# Patient Record
Sex: Female | Born: 1970
Health system: Southern US, Community
[De-identification: ages and names within clinical notes are randomized; demographics above are authoritative.]

## PROBLEM LIST (undated history)

## (undated) DIAGNOSIS — J449 Chronic obstructive pulmonary disease, unspecified: Secondary | ICD-10-CM

## (undated) DIAGNOSIS — F17200 Nicotine dependence, unspecified, uncomplicated: Secondary | ICD-10-CM

## (undated) DIAGNOSIS — F1910 Other psychoactive substance abuse, uncomplicated: Secondary | ICD-10-CM

## (undated) DIAGNOSIS — T8130XA Disruption of wound, unspecified, initial encounter: Secondary | ICD-10-CM

## (undated) HISTORY — PX: ABDOMINAL HYSTERECTOMY: SHX81

## (undated) HISTORY — DX: Chronic obstructive pulmonary disease, unspecified: J44.9

## (undated) HISTORY — DX: Nicotine dependence, unspecified, uncomplicated: F17.200

---

## 2001-06-04 ENCOUNTER — Ambulatory Visit (HOSPITAL_COMMUNITY): Admission: RE | Admit: 2001-06-04 | Discharge: 2001-06-04 | Payer: Self-pay | Admitting: Obstetrics and Gynecology

## 2001-06-04 ENCOUNTER — Encounter: Payer: Self-pay | Admitting: Obstetrics and Gynecology

## 2002-12-07 ENCOUNTER — Emergency Department (HOSPITAL_COMMUNITY): Admission: EM | Admit: 2002-12-07 | Discharge: 2002-12-07 | Payer: Self-pay | Admitting: Emergency Medicine

## 2002-12-07 ENCOUNTER — Encounter: Payer: Self-pay | Admitting: *Deleted

## 2004-10-20 ENCOUNTER — Emergency Department (HOSPITAL_COMMUNITY): Admission: EM | Admit: 2004-10-20 | Discharge: 2004-10-20 | Payer: Self-pay | Admitting: Emergency Medicine

## 2005-11-25 ENCOUNTER — Emergency Department (HOSPITAL_COMMUNITY): Admission: EM | Admit: 2005-11-25 | Discharge: 2005-11-25 | Payer: Self-pay | Admitting: Emergency Medicine

## 2006-11-22 ENCOUNTER — Emergency Department (HOSPITAL_COMMUNITY): Admission: EM | Admit: 2006-11-22 | Discharge: 2006-11-22 | Payer: Self-pay | Admitting: Emergency Medicine

## 2008-07-23 ENCOUNTER — Emergency Department (HOSPITAL_COMMUNITY): Admission: EM | Admit: 2008-07-23 | Discharge: 2008-07-23 | Payer: Self-pay | Admitting: Emergency Medicine

## 2009-04-21 ENCOUNTER — Other Ambulatory Visit: Admission: RE | Admit: 2009-04-21 | Discharge: 2009-04-21 | Payer: Self-pay | Admitting: Obstetrics & Gynecology

## 2009-07-13 ENCOUNTER — Encounter: Payer: Self-pay | Admitting: Obstetrics & Gynecology

## 2009-07-13 ENCOUNTER — Inpatient Hospital Stay (HOSPITAL_COMMUNITY): Admission: RE | Admit: 2009-07-13 | Discharge: 2009-07-15 | Payer: Self-pay | Admitting: Obstetrics & Gynecology

## 2009-09-28 ENCOUNTER — Ambulatory Visit: Payer: Self-pay | Admitting: Internal Medicine

## 2009-09-28 DIAGNOSIS — R109 Unspecified abdominal pain: Secondary | ICD-10-CM | POA: Insufficient documentation

## 2009-09-30 ENCOUNTER — Ambulatory Visit (HOSPITAL_COMMUNITY): Admission: RE | Admit: 2009-09-30 | Discharge: 2009-09-30 | Payer: Self-pay | Admitting: Internal Medicine

## 2009-10-01 ENCOUNTER — Encounter: Payer: Self-pay | Admitting: Internal Medicine

## 2009-10-02 DIAGNOSIS — R110 Nausea: Secondary | ICD-10-CM | POA: Insufficient documentation

## 2009-10-02 DIAGNOSIS — R112 Nausea with vomiting, unspecified: Secondary | ICD-10-CM | POA: Insufficient documentation

## 2009-10-02 DIAGNOSIS — R109 Unspecified abdominal pain: Secondary | ICD-10-CM | POA: Insufficient documentation

## 2009-10-02 DIAGNOSIS — R197 Diarrhea, unspecified: Secondary | ICD-10-CM | POA: Insufficient documentation

## 2009-10-02 DIAGNOSIS — K5900 Constipation, unspecified: Secondary | ICD-10-CM | POA: Insufficient documentation

## 2009-10-03 ENCOUNTER — Encounter: Payer: Self-pay | Admitting: Internal Medicine

## 2009-10-03 LAB — CONVERTED CEMR LAB
ALT: 32 units/L (ref 0–35)
AST: 18 units/L (ref 0–37)
Albumin: 4.4 g/dL (ref 3.5–5.2)
Alkaline Phosphatase: 59 units/L (ref 39–117)
Basophils Relative: 0 % (ref 0–1)
CO2: 24 meq/L (ref 19–32)
Calcium: 9.3 mg/dL (ref 8.4–10.5)
Chloride: 105 meq/L (ref 96–112)
Creatinine, Ser: 0.92 mg/dL (ref 0.40–1.20)
HCT: 40.7 % (ref 36.0–46.0)
Hemoglobin, Urine: NEGATIVE
Hemoglobin: 13.3 g/dL (ref 12.0–15.0)
Ketones, ur: NEGATIVE mg/dL
Leukocytes, UA: NEGATIVE
Lipase: 10 units/L (ref 0–75)
Lymphocytes Relative: 24 % (ref 12–46)
Lymphs Abs: 3.4 10*3/uL (ref 0.7–4.0)
Monocytes Relative: 4 % (ref 3–12)
Nitrite: NEGATIVE
Platelets: 220 10*3/uL (ref 150–400)
Protein, ur: NEGATIVE mg/dL
RBC: 4.49 M/uL (ref 3.87–5.11)
Specific Gravity, Urine: >1.03 (ref 1.005–1.030)
Total Bilirubin: 0.3 mg/dL (ref 0.3–1.2)
Urobilinogen, UA: 0.2 (ref 0.0–1.0)
WBC: 14.2 10*3/uL — ABNORMAL HIGH (ref 4.0–10.5)

## 2009-10-04 ENCOUNTER — Telehealth (INDEPENDENT_AMBULATORY_CARE_PROVIDER_SITE_OTHER): Payer: Self-pay

## 2009-10-06 ENCOUNTER — Telehealth (INDEPENDENT_AMBULATORY_CARE_PROVIDER_SITE_OTHER): Payer: Self-pay

## 2009-10-06 ENCOUNTER — Encounter: Payer: Self-pay | Admitting: Internal Medicine

## 2009-10-06 LAB — CONVERTED CEMR LAB
Eosinophils Absolute: 0.3 10*3/uL (ref 0.0–0.7)
HCT: 43.6 % (ref 36.0–46.0)
Lymphocytes Relative: 19 % (ref 12–46)
Lymphs Abs: 2.7 10*3/uL (ref 0.7–4.0)
Neutrophils Relative %: 76 % (ref 43–77)
Platelets: 261 10*3/uL (ref 150–400)
RBC: 4.86 M/uL (ref 3.87–5.11)
RDW: 13.9 % (ref 11.5–15.5)

## 2009-10-10 ENCOUNTER — Ambulatory Visit: Payer: Self-pay | Admitting: Internal Medicine

## 2009-10-10 DIAGNOSIS — L0293 Carbuncle, unspecified: Secondary | ICD-10-CM

## 2009-10-10 DIAGNOSIS — R1013 Epigastric pain: Secondary | ICD-10-CM | POA: Insufficient documentation

## 2009-10-10 DIAGNOSIS — L0292 Furuncle, unspecified: Secondary | ICD-10-CM | POA: Insufficient documentation

## 2009-10-10 DIAGNOSIS — D72829 Elevated white blood cell count, unspecified: Secondary | ICD-10-CM | POA: Insufficient documentation

## 2009-10-10 DIAGNOSIS — K219 Gastro-esophageal reflux disease without esophagitis: Secondary | ICD-10-CM | POA: Insufficient documentation

## 2009-10-10 HISTORY — DX: Furuncle, unspecified: L02.92

## 2009-10-11 ENCOUNTER — Encounter: Payer: Self-pay | Admitting: Internal Medicine

## 2009-10-17 ENCOUNTER — Telehealth (INDEPENDENT_AMBULATORY_CARE_PROVIDER_SITE_OTHER): Payer: Self-pay

## 2010-05-24 ENCOUNTER — Other Ambulatory Visit: Admission: RE | Admit: 2010-05-24 | Discharge: 2010-05-24 | Payer: Self-pay | Admitting: Obstetrics and Gynecology

## 2010-05-29 ENCOUNTER — Emergency Department (HOSPITAL_COMMUNITY): Admission: EM | Admit: 2010-05-29 | Discharge: 2010-05-29 | Payer: Self-pay | Admitting: Emergency Medicine

## 2010-06-28 ENCOUNTER — Ambulatory Visit (HOSPITAL_COMMUNITY): Admission: RE | Admit: 2010-06-28 | Payer: Self-pay | Source: Home / Self Care | Admitting: Obstetrics & Gynecology

## 2010-07-30 ENCOUNTER — Encounter: Payer: Self-pay | Admitting: Obstetrics & Gynecology

## 2010-08-08 NOTE — Progress Notes (Signed)
Summary: phone note, abd pain continues  Phone Note Call from Patient   Caller: Patient Summary of Call: Pt called and said she was seen for abd pain, and got report that her CT was normal. She c/o abd pain right below umbilical, and slightly to the left. She said the pain is so bad at times that it brings her to tears. She has had diarrhea for the last two days, no blood in it. She wakes up nauseated almost every AM. York Spaniel she has f/u appt 11/01/2009, but wants to know what she can do now. Please advise! [facility]). Initial call taken by: Cloria Spring LPN,  October 06, 2009 10:57 AM     Appended Document: phone note, abd pain continues per RMR pt needs to have cbc repeated before we can do anything. If pain becomes severe she should go to ED. Spoke with pt, she just had bloodwork done. Informed her of above. pt verbalized understanding.   Appended Document: phone note, abd pain continues needs ov this week w extender ; wbc remains elvated  Appended Document: phone note, abd pain continues Spoke with pt, she has an appt this AM with Tana Coast, PA.
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
## 2010-08-08 NOTE — Progress Notes (Signed)
Summary: still having pain  Phone Note Call from Patient Call back at [phone]   Caller: Patient Summary of Call: pt called-Hyoscyamine is not helping, she is still having alot of pain and nausea. no fever. She stated she seems to have a hard time controlling her bladder since she started taking the hyoscyamine.  Wants to know if there is something else she can take to help? please advise. Initial call taken by: Hendricks Limes LPN,  October 17, 2009 9:10 AM     Appended Document: still having pain stop hyosyamine; try librax on capsule ac and at bedtime #40 X 10 days (no refills); if no beter , may need tcs  Appended Document: still having pain pt aware, rx called to [facility] pharmacy  Appended Document: still having pain Need PR.  Appended Document: still having pain tried to call pt- NA, no voicemail  Appended Document: still having pain spoke with pts daughters boyfriend- whose number is the one listed in emr. He stated pt didnt have a working phone right now and when he sees her, he will tell her to call.   Appended Document: still having pain will address at upcoming ov

## 2010-08-08 NOTE — Miscellaneous (Signed)
Summary: Orders Update  Clinical Lists Changes  Orders: Added new Test order of T-CBC w/Diff ([id number]) - Signed 

## 2010-08-08 NOTE — Assessment & Plan Note (Signed)
Summary: to be seen with extender per rmr.gu   Visit Type:  f/u Primary Care Provider:  None  Chief Complaint:  still having abd pain.  History of Present Illness:  Patient comes back for short interval f/u due to ongoing lower abdominal pain. Pain continues to wake her up. It is unrelated to meals or BMs. She has pain everyday but at times without pain. Pain is at site of prior c-section and just above. It lasts for five to 30 mins at time. She lays in fetal position until it goes away. She gets dizzy and sweaty with it. Worse since total hysterectomy in 1/10. No improvement in pain, since bowel movements more regular. Stools are soft to loose, without blood. She c/o heartburn at least four times per week, with regurgitation to her throat. Denies urinary symptoms. She takes Cipro daily for chronic bumps in her groin and buttocks area. She has been on this since 12/10 as per Dr. Despina Hidden.   Recent labs showed glucose of 54, white count has been more than 14,000 twice. LFTs and U/A normal. CT A/P showed large stool burden.  Current Medications (verified): 1)  Estrogen 2mg  .... Once Daily 2)  Cipro 500 Mg Tabs (Ciprofloxacin Hcl) .... Once Daily  X 3 Months 3)  Percocet 5-325 Mg Tabs (Oxycodone-Acetaminophen) .... As Needed 4)  Miralax  Powd (Polyethylene Glycol 3350) .... Once Daily 5)  Benefiber  Powd (Wheat Dextrin) .... Once Daily 6)  Probiotic .... Once Daily  Allergies (verified): No Known Drug Allergies  Review of Systems      See HPI  Vital Signs:  Patient profile:   40 year old female Height:      68 inches Weight:      157 pounds BMI:     23.96 Temp:     98.7 degrees F oral Pulse rate:   80 / minute BP sitting:   132 / 90  (left arm) Cuff size:   regular  Vitals Entered By: Hendricks Limes LPN (October 10, 1608 9:38 AM)  Physical Exam  General:  Well developed, well nourished, no acute distress. Head:  Normocephalic and atraumatic. Eyes:  Sclera nonicteric. Mouth:  OP  moist. Abdomen:  Soft. Positive BS. Mild to moderate tenderness in the suprapubic region but also both lower quadrants. No rebound or guarding. No HSM or masses.  Rectal:  Examination of buttocks and perineal area revealed multiple scarred areas from prior "bumps". No active inflammation.  Genitalia:  Couple of indurated lesions in the groin area. No obvious abscess or drainage. Extremities:  No clubbing, cyanosis, edema or deformities noted. Neurologic:  Alert and  oriented x4;  grossly normal neurologically. Skin:  Intact without significant lesions or rashes. Psych:  Alert and cooperative. Normal mood and affect.  Impression & Recommendations:  Problem # 1:  ABDOMINAL PAIN, LOWER (ICD-789.09)  One year history of ongoing lower abdominal pain not associated with meals or bowel movements. Treatment of constipation has made no difference in her pain. She has had two documented mildly elevated WBCs of unclear significance. Will recheck in couple of weeks. Meantime, will try antispasmotic. Monitor closely. Further recommendations to follow. She will call with PR in one to two weeks.  May consider TCS for further evaluation.  Orders: Est. Patient Level III (96045)  Problem # 2:  EPIGASTRIC PAIN (ICD-789.06)  Epigastric pain on exam. GERD symptoms several days per week. Will add PPI. If symptoms do not improve, may consider EGD for further evaluation.  Orders: Est. Patient Level III (60454)  Problem # 3:  BOILS, RECURRENT (ICD-680.9)  Dermatology referral.   Orders: Est. Patient Level III (09811)  Other Orders: T-CBC w/Diff ([id number])  Patient Instructions: 1)  Please take Prilosec OTC once daily for heartburn (take 30 mins before first meal of day). Samples provided. I have provided you with prescription for generic to have filled at pharmacy. 2)  Please try hyoscyamine as needed for abdominal pain. 3)  Please have labs done in two weeks to follow up on her elevated white blood  cell count. 4)  Please see dermatologist about your chronic skin issues and chronic cipro use.  5)  The medication list was reviewed and reconciled.  All changed / newly prescribed medications were explained.  A complete medication list was provided to the patient / caregiver. Prescriptions: OMEPRAZOLE 20 MG CPDR (OMEPRAZOLE) one by mouth 30 mins before breakfast daily  #30 x 11   Entered and Authorized by:   Leanna Battles. Dixon Boos   Signed by:   Leanna Battles Mikko Lewellen PA-C on 10/10/2009   Method used:   Print then Give to Patient   RxID:   [id number] HYOSCYAMINE SULFATE 0.125 MG SUBL (HYOSCYAMINE SULFATE) one to two sublingual up to four times per day for abd pain.  #120 x 0   Entered and Authorized by:   Leanna Battles. Dixon Boos   Signed by:   Leanna Battles Breandan People PA-C on 10/10/2009   Method used:   Print then Give to Patient   RxID:   [id number]

## 2010-08-08 NOTE — Progress Notes (Signed)
Summary: miralax  Phone Note Call from Patient Call back at Home Phone [phone] Call back at [phone]   Summary of Call: pt called- left message- pharmacy told her Medicaid will pay for generic miralax if she had an Rx for it. pt uses [facility]. Is this ok to call in? please advise Initial call taken by: Hendricks Limes LPN,  October 04, 2009 12:26 PM     Appended Document: miralax yes; one years worth  Appended Document: miralax Rx called to [facility] .  Appended Document: miralax pt aware

## 2010-08-08 NOTE — Letter (Signed)
Summary: CT SCAN ORDER  CT SCAN ORDER   Imported By: Ave Filter 09/28/2009 16:37:30  _____________________________________________________________________  External Attachment:    Type:   Image     Comment:   External Document

## 2010-08-11 NOTE — Letter (Signed)
Summary: DERMATOLOGY REFERRAL  DERMATOLOGY REFERRAL   Imported By: Ave Filter 10/11/2009 10:01:49  _____________________________________________________________________  External Attachment:    Type:   Image     Comment:   External Document

## 2010-09-24 LAB — COMPREHENSIVE METABOLIC PANEL
ALT: 22 U/L (ref 0–35)
Alkaline Phosphatase: 40 U/L (ref 39–117)
Alkaline Phosphatase: 50 U/L (ref 39–117)
CO2: 25 mEq/L (ref 19–32)
CO2: 29 mEq/L (ref 19–32)
Chloride: 107 mEq/L (ref 96–112)
Chloride: 107 mEq/L (ref 96–112)
Creatinine, Ser: 0.76 mg/dL (ref 0.4–1.2)
GFR calc Af Amer: >60 mL/min (ref >60)
GFR calc Af Amer: >60 mL/min (ref >60)
GFR calc non Af Amer: >60 mL/min (ref >60)
Glucose, Bld: 119 mg/dL — ABNORMAL HIGH (ref 70–99)
Potassium: 4.2 mEq/L (ref 3.5–5.1)
Sodium: 140 mEq/L (ref 135–145)
Sodium: 140 mEq/L (ref 135–145)

## 2010-09-24 LAB — URINE MICROSCOPIC-ADD ON

## 2010-09-24 LAB — HCG, QUANTITATIVE, PREGNANCY: hCG, Beta Chain, Quant, S: <2 m[IU]/mL (ref <5)

## 2010-09-24 LAB — URINALYSIS, ROUTINE W REFLEX MICROSCOPIC
Leukocytes, UA: NEGATIVE
Specific Gravity, Urine: 1.03 (ref 1.005–1.030)
Urobilinogen, UA: 0.2 mg/dL (ref 0.0–1.0)

## 2010-09-24 LAB — DIFFERENTIAL
Basophils Absolute: 0.1 10*3/uL (ref 0.0–0.1)
Eosinophils Absolute: 0 10*3/uL (ref 0.0–0.7)
Eosinophils Relative: 0 % (ref 0–5)
Monocytes Absolute: 1.3 10*3/uL — ABNORMAL HIGH (ref 0.1–1.0)
Monocytes Relative: 7 % (ref 3–12)
Neutrophils Relative %: 83 % — ABNORMAL HIGH (ref 43–77)

## 2010-09-24 LAB — CBC
HCT: 39.5 % (ref 36.0–46.0)
RBC: 4.34 MIL/uL (ref 3.87–5.11)
WBC: 13.3 10*3/uL — ABNORMAL HIGH (ref 4.0–10.5)
WBC: 19.6 10*3/uL — ABNORMAL HIGH (ref 4.0–10.5)

## 2010-09-24 LAB — TYPE AND SCREEN
ABO/RH(D): A POS
Antibody Screen: NEGATIVE

## 2013-08-14 ENCOUNTER — Emergency Department (HOSPITAL_COMMUNITY)
Admission: EM | Admit: 2013-08-14 | Discharge: 2013-08-14 | Disposition: A | Discharge status: Home / Self Care | Payer: Self-pay | Attending: Emergency Medicine | Admitting: Emergency Medicine

## 2013-08-14 ENCOUNTER — Encounter (HOSPITAL_COMMUNITY): Payer: Self-pay | Admitting: Emergency Medicine

## 2013-08-14 DIAGNOSIS — R109 Unspecified abdominal pain: Secondary | ICD-10-CM | POA: Insufficient documentation

## 2013-08-14 DIAGNOSIS — R197 Diarrhea, unspecified: Secondary | ICD-10-CM | POA: Insufficient documentation

## 2013-08-14 DIAGNOSIS — R5383 Other fatigue: Secondary | ICD-10-CM

## 2013-08-14 DIAGNOSIS — R519 Headache, unspecified: Secondary | ICD-10-CM

## 2013-08-14 DIAGNOSIS — R1110 Vomiting, unspecified: Secondary | ICD-10-CM | POA: Insufficient documentation

## 2013-08-14 DIAGNOSIS — R5381 Other malaise: Secondary | ICD-10-CM | POA: Insufficient documentation

## 2013-08-14 DIAGNOSIS — F17200 Nicotine dependence, unspecified, uncomplicated: Secondary | ICD-10-CM | POA: Insufficient documentation

## 2013-08-14 DIAGNOSIS — R51 Headache: Secondary | ICD-10-CM | POA: Insufficient documentation

## 2013-08-14 MED ORDER — ONDANSETRON 8 MG PO TBDP
8.0000 mg | ORAL_TABLET | Freq: Once | ORAL | Status: AC
  Administered 2013-08-14: 8 mg via ORAL
  Filled 2013-08-14: qty 1

## 2013-08-14 MED ORDER — OXYCODONE-ACETAMINOPHEN 5-325 MG PO TABS
1.0000 | ORAL_TABLET | Freq: Once | ORAL | Status: AC
  Administered 2013-08-14: 1 via ORAL
  Filled 2013-08-14: qty 1

## 2013-08-14 MED ORDER — SODIUM CHLORIDE 0.9 % IV BOLUS (SEPSIS)
1000.0000 mL | Freq: Once | INTRAVENOUS | Status: AC
  Administered 2013-08-14: 1000 mL via INTRAVENOUS

## 2013-08-14 MED ORDER — ONDANSETRON HCL 8 MG PO TABS: 8.0000 mg | ORAL_TABLET | Freq: Three times a day (TID) | ORAL | Status: DC | PRN

## 2013-08-14 MED ORDER — OXYCODONE-ACETAMINOPHEN 5-325 MG PO TABS: 1.0000 | ORAL_TABLET | ORAL | Status: DC | PRN

## 2013-08-14 NOTE — ED Notes (Addendum)
MD at bedside. 

## 2013-08-14 NOTE — ED Provider Notes (Signed)
CSN: [id number]     Arrival date & time 08/14/13  0959 History  This chart was scribed for Joya Gaskins, MD by Ardelia Mems, ED Scribe. This patient was seen in room APA11/APA11 and the patient's care was started at 10:57 AM.   Chief Complaint  Patient presents with  . Emesis    Patient is a 43 y.o. female presenting with vomiting. The history is provided by the patient. No language interpreter was used.  Emesis Severity:  Moderate Duration:  3 days Timing:  Intermittent Number of daily episodes:  "multiple" Emesis appearance: non-bloody. Progression:  Unchanged Chronicity:  New Context: not post-tussive   Worsened by:  Nothing tried Ineffective treatments: Pepto Bismol. Associated symptoms: abdominal pain, diarrhea and headaches   Associated symptoms: no fever   Risk factors: no sick contacts and no travel to endemic areas     HPI Comments: BRIANNON BOGGIO is a 43 y.o. female who presents to the Emergency Department complaining of multiple daily episodes of non-bloody diarrhea with multiple associated daily episodes of non-bloody emesis over the past 3 days. She also reports associated intermittent, severe "cramping" abdominal pain that is worse in the mornings. She further reports associated nausea, headaches and fatigue, intermittently over the past 3 days. She states that due to these symptoms she has not been able to eat at all since Monday night, 4 nights ago. She has tried hydrocodone, Pepto Bismol and Advil without relief of symptoms. She states that she took Oxycodone about 5 hours ago with some relief fo her headache. She reports a history of hysterectomy. She denies any known recent sick contacts. She also denies any recent travel. She denies fever, dysuria or any other symptoms.    PMH - none Past Surgical History  Procedure Laterality Date  . Abdominal hysterectomy    . Cesarean section     Family History  Problem Relation Age of Onset  . Diabetes Other   .  Cancer Other    History  Substance Use Topics  . Smoking status: Current Every Day Smoker -- 1.00 packs/day for 30 years    Types: Cigarettes  . Smokeless tobacco: Never Used  . Alcohol Use: No   OB History   Grav Para Term Preterm Abortions TAB SAB Ect Mult Living   3 3 3       3      Review of Systems  Constitutional: Positive for fatigue. Negative for fever.  Gastrointestinal: Positive for nausea, vomiting, abdominal pain and diarrhea.  Genitourinary: Negative for dysuria.  Neurological: Positive for headaches.  All other systems reviewed and are negative.   Allergies  Review of patient's allergies indicates no known allergies.  Home Medications   Current Outpatient Rx  Name  Route  Sig  Dispense  Refill  . acetaminophen (TYLENOL) 500 MG tablet   Oral   Take 1,000 mg by mouth every 6 (six) hours as needed for mild pain or headache.         . bismuth subsalicylate (PEPTO BISMOL) 262 MG chewable tablet   Oral   Chew 524 mg by mouth as needed for diarrhea or loose stools.         Marland Kitchen ibuprofen (ADVIL,MOTRIN) 200 MG tablet   Oral   Take 800 mg by mouth every 6 (six) hours as needed for headache or moderate pain.          Triage Vitals: BP 100/61  Pulse 80  Temp(Src) 98.4 F (36.9 C) (Oral)  Resp  18  Ht 5\' 8"  (1.727 m)  Wt 170 lb (77.111 kg)  BMI 25.85 kg/m2  SpO2 96%  Physical Exam  Nursing note and vitals reviewed.  CONSTITUTIONAL: Well developed/well nourished HEAD: Normocephalic/atraumatic EYES: EOMI/PERRL, no icterus ENMT: Mucous membranes dry NECK: supple no meningeal signs SPINE:entire spine nontender CV: S1/S2 noted, no murmurs/rubs/gallops noted LUNGS: Lungs are clear to auscultation bilaterally, no apparent distress ABDOMEN: soft, nontender, no rebound or guarding GU:no cva tenderness NEURO: Pt is awake/alert, moves all extremitiesx4 EXTREMITIES: pulses normal, full ROM SKIN: warm, color normal PSYCH: no abnormalities of mood noted  ED  Course  Procedures   DIAGNOSTIC STUDIES: Oxygen Saturation is 96% on RA, normal by my interpretation.    COORDINATION OF CARE: 11:02 AM- Will order Zofran and Oxycodone. Pt advised of plan for treatment and pt agrees. 1:12 PM Pt felt improved prior to discharge when I spoke to her, but she then was noted to be hypotensive on d/c vitals.  IV fluids ordered.  Pt with vomiting/diarrhea, likely dehydration  Pt improved at discharge BP 95/63  Pulse 69  Temp(Src) 98 F (36.7 C) (Oral)  Resp 18  Ht 5\' 8"  (1.727 m)  Wt 170 lb (77.111 kg)  BMI 25.85 kg/m2  SpO2 96% She feels comfortable for d/c home.  I advised to increase oral fluids.   Stable for d/c  Medications  ondansetron (ZOFRAN-ODT) disintegrating tablet 8 mg (8 mg Oral Given 08/14/13 1108)  oxyCODONE-acetaminophen (PERCOCET/ROXICET) 5-325 MG per tablet 1 tablet (1 tablet Oral Given 08/14/13 1108)   Labs Review Labs Reviewed - No data to display Imaging Review No results found.  EKG Interpretation   None       MDM  No diagnosis found. Nursing notes including past medical history and social history reviewed and considered in documentation     I personally performed the services described in this documentation, which was scribed in my presence. The recorded information has been reviewed and is accurate.      Joya Gaskinsonald W Laney Bagshaw, MD 08/14/13 [phone]

## 2013-08-14 NOTE — ED Notes (Addendum)
Went in to d/c pt reviewed d/c instructions and obtained d/c vitals. Pt bp 82/58 left arm sitting, 82/57 in right arm sitting. Pt repositioned and bp 92/58 standing. Pt reports feeling nauseous and dizzy at time of v/s. EDP aware and give verbal order to d/c discharge and admin IV fluids.

## 2013-08-14 NOTE — ED Notes (Signed)
Pt states she feels better slightly but still c/o dizziness with sitting and standing

## 2013-08-14 NOTE — ED Notes (Signed)
Patient c/o nausea, vomiting, diarrhea, headache, fatigue, and chills since Monday night. Per patient last vomited this time. Patient reports taking hydrocodone Monday night, Pepto Bismol yesterday, and Advil yesterday with no relief.  Per patient took Oxycodone this morning at 6 with some relief in headache.

## 2014-03-04 ENCOUNTER — Encounter (HOSPITAL_COMMUNITY): Payer: Self-pay | Admitting: Emergency Medicine

## 2014-03-04 ENCOUNTER — Emergency Department (HOSPITAL_COMMUNITY)
Admission: EM | Admit: 2014-03-04 | Discharge: 2014-03-05 | Disposition: A | Discharge status: Home / Self Care | Payer: Self-pay | Attending: Emergency Medicine | Admitting: Emergency Medicine

## 2014-03-04 DIAGNOSIS — R404 Transient alteration of awareness: Secondary | ICD-10-CM | POA: Insufficient documentation

## 2014-03-04 DIAGNOSIS — J45901 Unspecified asthma with (acute) exacerbation: Secondary | ICD-10-CM

## 2014-03-04 DIAGNOSIS — F17200 Nicotine dependence, unspecified, uncomplicated: Secondary | ICD-10-CM | POA: Insufficient documentation

## 2014-03-04 DIAGNOSIS — R400 Somnolence: Secondary | ICD-10-CM

## 2014-03-04 DIAGNOSIS — G4710 Hypersomnia, unspecified: Secondary | ICD-10-CM | POA: Insufficient documentation

## 2014-03-04 DIAGNOSIS — M79609 Pain in unspecified limb: Secondary | ICD-10-CM | POA: Insufficient documentation

## 2014-03-04 DIAGNOSIS — J441 Chronic obstructive pulmonary disease with (acute) exacerbation: Secondary | ICD-10-CM | POA: Insufficient documentation

## 2014-03-04 NOTE — ED Notes (Signed)
Pt with multiple complaints, states she has been dizzy and weak for a month, occasional sob, pain in legs, tiredness

## 2014-03-05 ENCOUNTER — Emergency Department (HOSPITAL_COMMUNITY): Payer: Self-pay

## 2014-03-05 LAB — CBC WITH DIFFERENTIAL/PLATELET
BASOS PCT: 0 % (ref 0–1)
Basophils Absolute: 0 10*3/uL (ref 0.0–0.1)
EOS ABS: 0.4 10*3/uL (ref 0.0–0.7)
Eosinophils Relative: 5 % (ref 0–5)
HCT: 34.7 % — ABNORMAL LOW (ref 36.0–46.0)
Hemoglobin: 11.1 g/dL — ABNORMAL LOW (ref 12.0–15.0)
LYMPHS ABS: 2.7 10*3/uL (ref 0.7–4.0)
Lymphocytes Relative: 29 % (ref 12–46)
MCH: 28 pg (ref 26.0–34.0)
MCHC: 32 g/dL (ref 30.0–36.0)
MCV: 87.4 fL (ref 78.0–100.0)
Monocytes Absolute: 0.6 10*3/uL (ref 0.1–1.0)
Monocytes Relative: 7 % (ref 3–12)
NEUTROS PCT: 59 % (ref 43–77)
Neutro Abs: 5.5 10*3/uL (ref 1.7–7.7)
PLATELETS: 176 10*3/uL (ref 150–400)
RBC: 3.97 MIL/uL (ref 3.87–5.11)
RDW: 13.9 % (ref 11.5–15.5)
WBC: 9.2 10*3/uL (ref 4.0–10.5)

## 2014-03-05 LAB — COMPREHENSIVE METABOLIC PANEL
ALBUMIN: 3.4 g/dL — AB (ref 3.5–5.2)
ALK PHOS: 70 U/L (ref 39–117)
ALT: 19 U/L (ref 0–35)
AST: 19 U/L (ref 0–37)
Anion gap: 10 (ref 5–15)
BUN: 10 mg/dL (ref 6–23)
CHLORIDE: 104 meq/L (ref 96–112)
CO2: 28 mEq/L (ref 19–32)
Calcium: 8.6 mg/dL (ref 8.4–10.5)
Creatinine, Ser: 0.84 mg/dL (ref 0.50–1.10)
GFR calc Af Amer: >90 mL/min (ref >90)
GFR calc non Af Amer: 83 mL/min — ABNORMAL LOW (ref >90)
GLUCOSE: 88 mg/dL (ref 70–99)
POTASSIUM: 3.3 meq/L — AB (ref 3.7–5.3)
SODIUM: 142 meq/L (ref 137–147)
Total Bilirubin: 0.2 mg/dL — ABNORMAL LOW (ref 0.3–1.2)
Total Protein: 6.7 g/dL (ref 6.0–8.3)

## 2014-03-05 LAB — URINALYSIS, ROUTINE W REFLEX MICROSCOPIC
BILIRUBIN URINE: NEGATIVE
Glucose, UA: NEGATIVE mg/dL
Ketones, ur: NEGATIVE mg/dL
Leukocytes, UA: NEGATIVE
Nitrite: NEGATIVE
Protein, ur: NEGATIVE mg/dL
SPECIFIC GRAVITY, URINE: 1.025 (ref 1.005–1.030)
Urobilinogen, UA: 0.2 mg/dL (ref 0.0–1.0)
pH: 6 (ref 5.0–8.0)

## 2014-03-05 LAB — URINE MICROSCOPIC-ADD ON

## 2014-03-05 NOTE — Discharge Instructions (Signed)
Hypersomnia Hypersomnia usually brings recurrent episodes of excessive daytime sleepiness or prolonged nighttime sleep. It is different than feeling tired due to lack of or interrupted sleep at night. People with hypersomnia are compelled to nap repeatedly during the day. This is often at inappropriate times such as:  At work.  During a meal.  In conversation. These daytime naps usually provide no relief. This disorder typically affects adolescents and young adults. CAUSES  This condition may be caused by:  Another sleep disorder (such as narcolepsy or sleep apnea).  Dysfunction of the autonomic nervous system.  Drug or alcohol abuse.  A physical problem, such as:  A tumor.  Head trauma. This is damage caused by an accident.  Injury to the central nervous system.  Certain medications, or medicine withdrawal.  Medical conditions may contribute to the disorder, including:  Multiple sclerosis.  Depression.  Encephalitis.  Epilepsy.  Obesity.  Some people appear to have a genetic predisposition to this disorder. In others, there is no known cause. SYMPTOMS   Patients often have difficulty waking from a long sleep. They may feel dazed or confused.  Other symptoms may include:  Anxiety.  Increased irritation (inflammation).  Decreased energy.  Restlessness.  Slow thinking.  Slow speech.  Loss of appetite.  Hallucinations.  Memory difficulty.  Tremors, Tics.  Some patients lose the ability to function in family, social, occupational, or other settings. TREATMENT  Treatment is symptomatic in nature. Stimulants and other drugs may be used to treat this disorder. Changes in behavior may help. For example, avoid night work and social activities that delay bed time. Changes in diet may offer some relief. Patients should avoid alcohol and caffeine. PROGNOSIS  The likely outcome (prognosis) for persons with hypersomnia depends on the cause of the disorder.  The disorder itself is not life threatening. But it can have serious consequences. For example, automobile accidents can be caused by falling asleep while driving. The attacks usually continue indefinitely. Document Released: 06/15/2002 Document Revised: 09/17/2011 Document Reviewed: 05/19/2008 Exit[employer] Patient Information 2015 [employer]. This information is not intended to replace advice given to you by your health care provider. Make sure you discuss any questions you have with your health care provider.  

## 2014-03-05 NOTE — ED Notes (Signed)
Pt instructed to follow up with pulmonary clinic for sleep apnea testing. Given work note per pt request as pt was in ED so late. EDP, gave verbal consent.

## 2014-03-05 NOTE — ED Provider Notes (Addendum)
CSN: [id number]     Arrival date & time 03/04/14  2334 History   First MD Initiated Contact with Patient 03/05/14 0001    This chart was scribed for Lindsay Seamen, MD by Marica Otter, ED Scribe. This patient was seen in room APA10/APA10 and the patient's care was started at 12:04 AM.  Chief Complaint  Patient presents with  . Difficulty Staying Awake    The history is provided by the patient and a relative. No language interpreter was used.   PCP: No PCP Per Patient HPI Comments: Lindsay Mcmillan is a 43 y.o. female who presents to the Emergency Department complaining of difficulty staying awake onset several months ago. Pt reports she frequently falls asleep suddenly and is unable to keep herself awake. Per family member, pt snores very loudly and on occasion stops breathing in her sleep. Her husband is on CPAP for obstructive sleep apnea.  Additional Complaints: Pt also complains of swelling of her feet bilaterally onset a couple of weeks ago. Pt complains of pain to her left leg, though, pt notes that she is not presently experiencing any pain in her legs. Pt also complains of subjective fever with associated n/v, however, denies diarrhea.   The patient states she is short of breath but this is her baseline due to COPD and asthma. Pt also complains of intermittent lower abd pain onset one week ago. Pt denies dysuria.   The patient later confessed to her nurse that she had taken a friend's morphine prior to arrival. This was for her leg pain.   History reviewed. No pertinent past medical history. Past Surgical History  Procedure Laterality Date  . Abdominal hysterectomy    . Cesarean section     Family History  Problem Relation Age of Onset  . Diabetes Other   . Cancer Other    History  Substance Use Topics  . Smoking status: Current Every Day Smoker -- 1.00 packs/day for 30 years    Types: Cigarettes  . Smokeless tobacco: Never Used  . Alcohol Use: No   OB History   Grav  Para Term Preterm Abortions TAB SAB Ect Mult Living   Review of Systems A complete 10 system review of systems was obtained and all systems are negative except as noted in the HPI and PMH.     Allergies  Review of patient's allergies indicates no known allergies.  Home Medications   Prior to Admission medications   Medication Sig Start Date End Date Taking? Authorizing Provider  acetaminophen (TYLENOL) 500 MG tablet Take 1,000 mg by mouth every 6 (six) hours as needed for mild pain or headache.    Historical Provider, MD  bismuth subsalicylate (PEPTO BISMOL) 262 MG chewable tablet Chew 524 mg by mouth as needed for diarrhea or loose stools.    Historical Provider, MD  ibuprofen (ADVIL,MOTRIN) 200 MG tablet Take 800 mg by mouth every 6 (six) hours as needed for headache or moderate pain.    Historical Provider, MD  ondansetron (ZOFRAN) 8 MG tablet Take 1 tablet (8 mg total) by mouth every 8 (eight) hours as needed for nausea. 08/14/13   Joya Gaskins, MD  oxyCODONE-acetaminophen (PERCOCET/ROXICET) 5-325 MG per tablet Take 1 tablet by mouth every 4 (four) hours as needed for severe pain. 08/14/13   Joya Gaskins, MD   Triage Vitals: BP 143/94  Pulse 62  Temp(Src) 98.4 F (36.9  C) (Oral)  Resp 14  Ht  (1.727 m)  Wt 183 lb (83.008 kg)  BMI 27.83 kg/m2  SpO2 96% Physical Exam  Nursing note and vitals reviewed.  General: Well-developed, well-nourished female in no acute distress; appearance consistent with age of record HENT: normocephalic; atraumatic Eyes: pupils equal, round and reactive to light; extraocular muscles intact Neck: supple Heart: regular rate and rhythm; no murmurs, rubs or gallops Lungs: Clear to auscultation bilaterally Abdomen: soft; nondistended; nontender; no masses or hepatosplenomegaly; bowel sounds present; mild suprapubic tenderness Extremities: No deformity; full range of motion; pulses normal; trace edema of lower legs  bilaterally  Neurologic: Awake, alert and oriented; motor function intact in all extremities and symmetric; no facial droop Skin: Warm and dry Psychiatric: Flat affect   ED Course  Procedures (including critical care time) DIAGNOSTIC STUDIES: Oxygen Saturation is 96% on RA, nl by my interpretation.    COORDINATION OF CARE: 12:10 AM-Discussed treatment plan which includes EKG, labs with pt at bedside and pt agreed to plan.    MDM   Nursing notes and vitals signs, including pulse oximetry, reviewed.  Summary of this visit's results, reviewed by myself:  Labs:  Results for orders placed during the hospital encounter of 03/04/14 (from the past 24 hour(s))  CBC WITH DIFFERENTIAL     Status: Abnormal   Collection Time    03/05/14 12:45 AM      Result Value Ref Range   WBC 9.2  4.0 - 10.5 K/uL   RBC 3.97  3.87 - 5.11 MIL/uL   Hemoglobin 11.1 (*) 12.0 - 15.0 g/dL   HCT 16.1 (*) 09.6 - 04.5 %   MCV 87.4  78.0 - 100.0 fL   MCH 28.0  26.0 - 34.0 pg   MCHC 32.0  30.0 - 36.0 g/dL   RDW 40.9  81.1 - 91.4 %   Platelets 176  150 - 400 K/uL   Neutrophils Relative % 59  43 - 77 %   Neutro Abs 5.5  1.7 - 7.7 K/uL   Lymphocytes Relative 29  12 - 46 %   Lymphs Abs 2.7  0.7 - 4.0 K/uL   Monocytes Relative 7  3 - 12 %   Monocytes Absolute 0.6  0.1 - 1.0 K/uL   Eosinophils Relative 5  0 - 5 %   Eosinophils Absolute 0.4  0.0 - 0.7 K/uL   Basophils Relative 0  0 - 1 %   Basophils Absolute 0.0  0.0 - 0.1 K/uL  COMPREHENSIVE METABOLIC PANEL     Status: Abnormal   Collection Time    03/05/14 12:45 AM      Result Value Ref Range   Sodium 142  137 - 147 mEq/L   Potassium 3.3 (*) 3.7 - 5.3 mEq/L   Chloride 104  96 - 112 mEq/L   CO2 28  19 - 32 mEq/L   Glucose, Bld 88  70 - 99 mg/dL   BUN 10  6 - 23 mg/dL   Creatinine, Ser 7.82  0.50 - 1.10 mg/dL   Calcium 8.6  8.4 - 95.6 mg/dL   Total Protein 6.7  6.0 - 8.3 g/dL   Albumin 3.4 (*) 3.5 - 5.2 g/dL   AST 19  0 - 37 U/L   ALT 19  0 - 35 U/L    Alkaline Phosphatase 70  39 - 117 U/L   Total Bilirubin 0.2 (*) 0.3 - 1.2 mg/dL   GFR calc non Af Amer 83 (*) >  90 mL/min   GFR calc Af Amer >90  >90 mL/min   Anion gap 10  5 - 15  URINALYSIS, ROUTINE W REFLEX MICROSCOPIC     Status: Abnormal   Collection Time    03/05/14 12:57 AM      Result Value Ref Range   Color, Urine YELLOW  YELLOW   APPearance CLEAR  CLEAR   Specific Gravity, Urine 1.025  1.005 - 1.030   pH 6.0  5.0 - 8.0   Glucose, UA NEGATIVE  NEGATIVE mg/dL   Hgb urine dipstick TRACE (*) NEGATIVE   Bilirubin Urine NEGATIVE  NEGATIVE   Ketones, ur NEGATIVE  NEGATIVE mg/dL   Protein, ur NEGATIVE  NEGATIVE mg/dL   Urobilinogen, UA 0.2  0.0 - 1.0 mg/dL   Nitrite NEGATIVE  NEGATIVE   Leukocytes, UA NEGATIVE  NEGATIVE  URINE MICROSCOPIC-ADD ON     Status: Abnormal   Collection Time    03/05/14 12:57 AM      Result Value Ref Range   Squamous Epithelial / LPF RARE  RARE   WBC, UA 0-2  <3 WBC/hpf   RBC / HPF 0-2  <3 RBC/hpf   Bacteria, UA MANY (*) RARE   Crystals CA OXALATE CRYSTALS (*) NEGATIVE   Urine-Other MUCOUS PRESENT      Imaging Studies: Dg Chest 2 View  03/05/2014   CLINICAL DATA:  Shortness of breath. Lower extremity swelling. Smoker.  EXAM: CHEST  2 VIEW  COMPARISON:  None.  FINDINGS: Normal sized heart. Clear lungs. The lungs are hyperexpanded with diffuse peribronchial thickening. Unremarkable bones.  IMPRESSION: No acute abnormality.  Changes of COPD and chronic bronchitis.   Electronically Signed   By: Gordan Payment M.D.   On: 03/05/2014 00:54   2:15 AM Patient has been somnolent but readily arousable in the ED. She has not had observed episodes of apnea per her history is concerning for obstructive sleep apnea. Will refer her to pulmonology for evaluation of sleep apnea. She was advised not to take sedating drugs at bedtime especially if not prescribed for her.   I personally performed the services described in this documentation, which was scribed in my  presence. The recorded information has been reviewed and is accurate.   Lindsay Seamen, MD 03/05/14 1914  Lindsay Seamen, MD 03/05/14 7829  Lindsay Seamen, MD 03/05/14 5621

## 2014-05-10 ENCOUNTER — Encounter (HOSPITAL_COMMUNITY): Payer: Self-pay | Admitting: Emergency Medicine

## 2018-01-17 ENCOUNTER — Other Ambulatory Visit: Payer: Self-pay

## 2018-01-17 ENCOUNTER — Emergency Department (HOSPITAL_COMMUNITY)
Admission: EM | Admit: 2018-01-17 | Discharge: 2018-01-17 | Disposition: A | Discharge status: Home / Self Care | Payer: Self-pay | Attending: Emergency Medicine | Admitting: Emergency Medicine

## 2018-01-17 ENCOUNTER — Encounter (HOSPITAL_COMMUNITY): Payer: Self-pay | Admitting: Emergency Medicine

## 2018-01-17 DIAGNOSIS — F17210 Nicotine dependence, cigarettes, uncomplicated: Secondary | ICD-10-CM | POA: Insufficient documentation

## 2018-01-17 DIAGNOSIS — L237 Allergic contact dermatitis due to plants, except food: Secondary | ICD-10-CM | POA: Insufficient documentation

## 2018-01-17 DIAGNOSIS — Z79899 Other long term (current) drug therapy: Secondary | ICD-10-CM | POA: Insufficient documentation

## 2018-01-17 MED ORDER — PREDNISONE 10 MG PO TABS: ORAL_TABLET | ORAL | 0 refills | Status: DC

## 2018-01-17 MED ORDER — DEXAMETHASONE SODIUM PHOSPHATE 10 MG/ML IJ SOLN
10.0000 mg | Freq: Once | INTRAMUSCULAR | Status: AC
  Administered 2018-01-17: 10 mg via INTRAMUSCULAR
  Filled 2018-01-17: qty 1

## 2018-01-17 NOTE — Discharge Instructions (Signed)
You have received today's dose of steroids with the shot given.  Start taking your prednisone taper tomorrow morning.  Additionally as discussed, keeping your skin cool, even ice packs can help with extreme itching.  You may use calamine lotion also for itching or to dry up any oozing rash sites.  Other medications that can help with itch include Benadryl, Pepcid as discussed which you can take twice daily, also anti-itch cream, generic Gold[employer] anti-itch cream is very helpful for itchy rashes.  Plan a recheck for any persistent or worsening symptoms.  Expect significant improvement in the itch and less spreading of this rash over the next 48 hours as the steroids start becoming effective.  Wash any surfaces that may have come in contact with the oil from this plant.

## 2018-01-17 NOTE — ED Triage Notes (Signed)
Pt reports coming in contact with poison oak/ivy 7 days ago. States rash began on her hands, but has now spread all over body including her face causing RT eye swelling.

## 2018-01-17 NOTE — ED Provider Notes (Signed)
[facility] EMERGENCY DEPARTMENT Provider Note   CSN: [id number] Arrival date & time: 01/17/18  1015     History   Chief Complaint Chief Complaint  Patient presents with  . Rash    HPI Lindsay Mcmillan is a 47 y.o. female presenting with known exposure to poison ivy when weeding in her yard a week ago.  She reports spreading itchy rash which started on her hands, now involving bilateral forearms, neck, right face including perorbital area and bilateral thighs.  She has used multiple home treatments per family and friend recommendations including topical clorox (not on face), apple cider vinegar, baking soda and also used calamine lotion which has helped to dry up a few blisters on her legs.  She denies fevers, chills, vision changes, cough, sob, tick bites, body aches or other complaints.  The history is provided by the patient.    History reviewed. No pertinent past medical history.  Patient Active Problem List   Diagnosis Date Noted  . LEUKOCYTOSIS 10/10/2009  . GERD 10/10/2009  . BOILS, RECURRENT 10/10/2009  . EPIGASTRIC PAIN 10/10/2009  . CONSTIPATION 10/02/2009  . NAUSEA 10/02/2009  . DIARRHEA 10/02/2009  . ABDOMINAL PAIN, LOWER 10/02/2009  . ABDOMINAL PAIN 09/28/2009    Past Surgical History:  Procedure Laterality Date  . ABDOMINAL HYSTERECTOMY    . CESAREAN SECTION       OB History    Gravida  3   Para  3   Term  3   Preterm      AB      Living  3     SAB      TAB      Ectopic      Multiple      Live Births               Home Medications    Prior to Admission medications   Medication Sig Start Date End Date Taking? Authorizing Provider  acetaminophen (TYLENOL) 500 MG tablet Take 1,000 mg by mouth every 6 (six) hours as needed for mild pain or headache.   Yes [provider]  bismuth subsalicylate (PEPTO BISMOL) 262 MG chewable tablet Chew 524 mg by mouth as needed for diarrhea or loose stools.   Yes [provider]  ibuprofen (ADVIL,MOTRIN) 200 MG tablet Take 800 mg by mouth every 6 (six) hours as needed for headache or moderate pain.   Yes [provider]  ondansetron (ZOFRAN) 8 MG tablet Take 1 tablet (8 mg total) by mouth every 8 (eight) hours as needed for nausea. 08/14/13  Yes Zadie Rhine, MD  predniSONE (DELTASONE) 10 MG tablet Take 6 tabs daily by mouth for 2 day,  Then 5 tabs daily for 2 days,  4 tabs daily for 2 days,  3 tabs daily for 2 days,  2 tabs daily for 2 days,  Then 1 tab daily for 2 days. 01/17/18   Burgess Amor, PA-C    Family History Family History  Problem Relation Age of Onset  . Diabetes Other   . Cancer Other     Social History Social History   Tobacco Use  . Smoking status: Current Every Day Smoker    Packs/day: 1.00    Years: 30.00    Pack years: 30.00    Types: Cigarettes  . Smokeless tobacco: Never Used  Substance Use Topics  . Alcohol use: No  . Drug use: No     Allergies   Patient has no known  allergies.   Review of Systems Review of Systems  Constitutional: Negative for chills and fever.  HENT: Negative for facial swelling.   Respiratory: Negative for shortness of breath and wheezing.   Skin: Positive for rash.  Neurological: Negative for numbness.     Physical Exam Updated Vital Signs BP (!) 170/90 (BP Location: Left Arm)   Pulse 66   Temp 98.5 F (36.9 C) (Oral)   Resp 15   Ht 5' 8.5" (1.74 m)   Wt 79.8 kg (176 lb)   SpO2 94%   BMI 26.37 kg/m   Physical Exam  Constitutional: She appears well-developed and well-nourished. No distress.  HENT:  Head: Normocephalic.  Eyes: Conjunctivae are normal.  Right periorbital rash and eyelid edema. No bulla or draining lesions.  Right check also involved.  Neck: Neck supple.  Cardiovascular: Normal rate.  Pulmonary/Chest: Effort normal. She has no wheezes.  Musculoskeletal: Normal range of motion. She exhibits no edema.  Skin: Rash noted. Rash is maculopapular and vesicular.    Scattered rash c/w contact dermatitis dorsal hands, forearms, less on upper arms. Right face and legs.     ED Treatments / Results  Labs (all labs ordered are listed, but only abnormal results are displayed) Labs Reviewed - No data to display  EKG None  Radiology No results found.  Procedures Procedures (including critical care time)  Medications Ordered in ED Medications  dexamethasone (DECADRON) injection 10 mg (10 mg Intramuscular Given 01/17/18 1121)     Initial Impression / Assessment and Plan / ED Course  I have reviewed the triage vital signs and the nursing notes.  Pertinent labs & imaging results that were available during my care of the patient were reviewed by me and considered in my medical decision making (see chart for details).     Discussed home tx including benadryl and pepcid for itch relief, stop using clorox on skin, prednisone 12 day taper, decadron given here. Anti itch cream, continued to calamine on any wet or draining locations. F/u for any worsened sx. Impressive contact derm rash with no evidence of skin infection.   Final Clinical Impressions(s) / ED Diagnoses   Final diagnoses:  Poison ivy dermatitis    ED Discharge Orders        Ordered    predniSONE (DELTASONE) 10 MG tablet     01/17/18 1118       Burgess Amordol, Mccormick [place], PA-C 01/18/18 [id number]    Eber HongMiller, Brian, MD 01/21/18 1002

## 2019-07-24 ENCOUNTER — Other Ambulatory Visit: Payer: Self-pay

## 2019-07-24 ENCOUNTER — Ambulatory Visit: Payer: Self-pay | Attending: Internal Medicine

## 2019-07-24 DIAGNOSIS — Z20822 Contact with and (suspected) exposure to covid-19: Secondary | ICD-10-CM | POA: Insufficient documentation

## 2019-07-25 LAB — NOVEL CORONAVIRUS, NAA: SARS-CoV-2, NAA: NOT DETECTED

## 2019-07-27 ENCOUNTER — Telehealth: Payer: Self-pay | Admitting: *Deleted

## 2019-07-27 NOTE — Telephone Encounter (Signed)
Pt given result of COVID test; she verbalized understanding. 

## 2019-08-03 DIAGNOSIS — M79671 Pain in right foot: Secondary | ICD-10-CM | POA: Diagnosis not present

## 2019-08-03 DIAGNOSIS — M722 Plantar fascial fibromatosis: Secondary | ICD-10-CM | POA: Diagnosis not present

## 2019-08-11 ENCOUNTER — Ambulatory Visit: Payer: BC Managed Care – PPO | Admitting: Family Medicine

## 2019-08-11 ENCOUNTER — Encounter: Payer: Self-pay | Admitting: Family Medicine

## 2019-08-11 ENCOUNTER — Other Ambulatory Visit: Payer: Self-pay

## 2019-08-11 VITALS — BP 141/86 | HR 87 | Temp 98.3°F | Ht 68.5 in | Wt 185.0 lb

## 2019-08-11 DIAGNOSIS — R0681 Apnea, not elsewhere classified: Secondary | ICD-10-CM

## 2019-08-11 DIAGNOSIS — L0291 Cutaneous abscess, unspecified: Secondary | ICD-10-CM | POA: Diagnosis not present

## 2019-08-11 DIAGNOSIS — J449 Chronic obstructive pulmonary disease, unspecified: Secondary | ICD-10-CM | POA: Diagnosis not present

## 2019-08-11 DIAGNOSIS — Z1231 Encounter for screening mammogram for malignant neoplasm of breast: Secondary | ICD-10-CM | POA: Insufficient documentation

## 2019-08-11 DIAGNOSIS — F17200 Nicotine dependence, unspecified, uncomplicated: Secondary | ICD-10-CM | POA: Insufficient documentation

## 2019-08-11 HISTORY — DX: Encounter for screening mammogram for malignant neoplasm of breast: Z12.31

## 2019-08-11 MED ORDER — DOXYCYCLINE HYCLATE 100 MG PO TABS: 100.0000 mg | ORAL_TABLET | Freq: Two times a day (BID) | ORAL | 0 refills | Status: DC

## 2019-08-11 MED ORDER — NICOTINE 21 MG/24HR TD PT24: 21.0000 mg | MEDICATED_PATCH | Freq: Every day | TRANSDERMAL | 0 refills | Status: DC

## 2019-08-11 MED ORDER — ALBUTEROL SULFATE HFA 108 (90 BASE) MCG/ACT IN AERS: 2.0000 | INHALATION_SPRAY | Freq: Four times a day (QID) | RESPIRATORY_TRACT | 1 refills | Status: DC | PRN
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
## 2019-08-11 NOTE — Patient Instructions (Addendum)
Sleep specialist Albuterol -pick up at pharmacy to use for cough/short of breath Nicotine patches-DO NOT SMOKE WITH PATCH ON Mammogram  Doxy-pick up at pharmacy to use for abscess

## 2019-08-12 ENCOUNTER — Other Ambulatory Visit (HOSPITAL_COMMUNITY)
Admission: RE | Admit: 2019-08-12 | Discharge: 2019-08-12 | Disposition: A | Discharge status: Home / Self Care | Payer: BC Managed Care – PPO | Source: Ambulatory Visit | Attending: Family Medicine | Admitting: Family Medicine

## 2019-08-12 ENCOUNTER — Other Ambulatory Visit (HOSPITAL_COMMUNITY): Payer: Self-pay

## 2019-08-12 DIAGNOSIS — Z1231 Encounter for screening mammogram for malignant neoplasm of breast: Secondary | ICD-10-CM

## 2019-08-14 LAB — AEROBIC CULTURE W GRAM STAIN (SUPERFICIAL SPECIMEN)

## 2019-08-14 LAB — AEROBIC CULTURE? (SUPERFICIAL SPECIMEN): Gram Stain: NONE SEEN

## 2019-08-14 LAB — AEROBIC CULTURE  (SUPERFICIAL SPECIMEN)

## 2019-08-17 ENCOUNTER — Telehealth: Payer: Self-pay

## 2019-08-17 DIAGNOSIS — Z1231 Encounter for screening mammogram for malignant neoplasm of breast: Secondary | ICD-10-CM

## 2019-08-17 NOTE — Telephone Encounter (Signed)
LeighAnn Charelle Petrakis, CMA  

## 2019-08-21 ENCOUNTER — Ambulatory Visit (HOSPITAL_COMMUNITY): Payer: BC Managed Care – PPO

## 2019-08-28 ENCOUNTER — Ambulatory Visit (HOSPITAL_COMMUNITY): Payer: BC Managed Care – PPO

## 2019-09-09 ENCOUNTER — Ambulatory Visit (INDEPENDENT_AMBULATORY_CARE_PROVIDER_SITE_OTHER): Payer: BC Managed Care – PPO | Admitting: Family Medicine

## 2019-09-09 ENCOUNTER — Other Ambulatory Visit: Payer: Self-pay

## 2019-09-09 ENCOUNTER — Encounter: Payer: Self-pay | Admitting: Family Medicine

## 2019-09-09 VITALS — BP 150/80 | HR 69 | Temp 97.8°F | Ht 68.5 in | Wt 186.8 lb

## 2019-09-09 DIAGNOSIS — F17200 Nicotine dependence, unspecified, uncomplicated: Secondary | ICD-10-CM | POA: Diagnosis not present

## 2019-09-09 DIAGNOSIS — R030 Elevated blood-pressure reading, without diagnosis of hypertension: Secondary | ICD-10-CM

## 2019-09-09 DIAGNOSIS — J449 Chronic obstructive pulmonary disease, unspecified: Secondary | ICD-10-CM | POA: Diagnosis not present

## 2019-09-09 DIAGNOSIS — R079 Chest pain, unspecified: Secondary | ICD-10-CM | POA: Diagnosis not present

## 2019-09-09 DIAGNOSIS — R1013 Epigastric pain: Secondary | ICD-10-CM

## 2019-09-09 MED ORDER — HYDROCHLOROTHIAZIDE 12.5 MG PO CAPS: 12.5000 mg | ORAL_CAPSULE | Freq: Every day | ORAL | 0 refills | Status: DC

## 2019-09-09 NOTE — Progress Notes (Signed)
Established Patient Office Visit  Subjective:  Patient ID: Lindsay Mcmillan, female    DOB: Jun 17, 1971  Age: 49 y.o. MRN: [id number]  CC:  Chief Complaint  Patient presents with  . Follow-up    from 08/10/18 visit . Have not been able to get in touch with sleep study. Bp has been running high but is under alot of stress  . Edema    edema in both legs ans feet for the past 7-8 months now    HPI Lindsay Mcmillan presents for LE edema  NO heart problems-feels palpitations.  Chest pain noted -substernal-sharp pain-with rest and with exertion.  Pain Scale 9/10.  No nausea.  No radiation to neck/arm. Associated SOB. FH+father MI, grandmother MI. +TOB use 1pk/day-tried nicotine patches-"did not work" pt state she did not smoke less.  Past Surgical History:  Procedure Laterality Date  . ABDOMINAL HYSTERECTOMY    . CESAREAN SECTION      Family History  Problem Relation Age of Onset  . Diabetes Other   . Cancer Other     Social History   Socioeconomic History  . Marital status: Single    Spouse name: Not on file  . Number of children: Not on file  . Years of education: Not on file  . Highest education level: Not on file  Occupational History  . Not on file  Tobacco Use  . Smoking status: Current Every Day Smoker    Packs/day: 1.00    Years: 30.00    Pack years: 30.00    Types: Cigarettes  . Smokeless tobacco: Never Used  Substance and Sexual Activity  . Alcohol use: No  . Drug use: No  . Sexual activity: Not on file  Other Topics Concern  . Not on file  Social History Narrative  . Not on file   Social Determinants of Health   Financial Resource Strain:   . Difficulty of Paying Living Expenses: Not on file  Food Insecurity:   . Worried About Charity fundraiser in the Last Year: Not on file  . Ran Out of Food in the Last Year: Not on file  Transportation Needs:   . Lack of Transportation (Medical): Not on file  . Lack of Transportation (Non-Medical): Not on file   Physical Activity:   . Days of Exercise per Week: Not on file  . Minutes of Exercise per Session: Not on file  Stress:   . Feeling of Stress : Not on file  Social Connections:   . Frequency of Communication with Friends and Family: Not on file  . Frequency of Social Gatherings with Friends and Family: Not on file  . Attends Religious Services: Not on file  . Active Member of Clubs or Organizations: Not on file  . Attends Archivist Meetings: Not on file  . Marital Status: Not on file  Intimate Partner Violence:   . Fear of Current or Ex-Partner: Not on file  . Emotionally Abused: Not on file  . Physically Abused: Not on file  . Sexually Abused: Not on file    Outpatient Medications Prior to Visit  Medication Sig Dispense Refill  . acetaminophen (TYLENOL) 500 MG tablet Take 1,000 mg by mouth every 6 (six) hours as needed for mild pain or headache.    . albuterol (VENTOLIN HFA) 108 (90 Base) MCG/ACT inhaler Inhale 2 puffs into the lungs every 6 (six) hours as needed for wheezing or shortness of breath. 18 g 1  .  ibuprofen (ADVIL,MOTRIN) 200 MG tablet Take 800 mg by mouth every 6 (six) hours as needed for headache or moderate pain.    Marland Kitchen doxycycline (VIBRA-TABS) 100 MG tablet Take 1 tablet (100 mg total) by mouth 2 (two) times daily. 20 tablet 0  . nicotine (NICODERM CQ) 21 mg/24hr patch Place 1 patch (21 mg total) onto the skin daily. 28 patch 0   No facility-administered medications prior to visit.    No Known Allergies  ROS Review of Systems  Constitutional: Positive for fatigue.  HENT: Negative.   Eyes: Negative.   Respiratory:       COPD  Cardiovascular: Positive for chest pain, palpitations and leg swelling.  Gastrointestinal: Negative.   Endocrine: Negative.   Allergic/Immunologic: Negative.   Neurological: Negative for dizziness and headaches.  Hematological: Negative.   Psychiatric/Behavioral: Negative.       Objective:    Physical Exam   Constitutional: She is oriented to person, place, and time. She appears well-developed and well-nourished.  HENT:  Head: Normocephalic and atraumatic.  Cardiovascular: Normal rate and regular rhythm.  Pulmonary/Chest: Effort normal and breath sounds normal.  Abdominal: Soft. Bowel sounds are normal.  Neurological: She is oriented to person, place, and time.  Psychiatric: She has a normal mood and affect.    BP (!) 150/80 (BP Location: Right Arm, Patient Position: Sitting, Cuff Size: Large)   Pulse 69   Temp 97.8 F (36.6 C) (Temporal)   Ht 5' 8.5" (1.74 m)   Wt 186 lb 12.8 oz (84.7 kg)   SpO2 97%   BMI 27.99 kg/m  Wt Readings from Last 3 Encounters:  09/09/19 186 lb 12.8 oz (84.7 kg)  08/11/19 185 lb (83.9 kg)  01/17/18 176 lb (79.8 kg)     Health Maintenance Due  Topic Date Due  . PAP SMEAR-Modifier  08/07/1989  . MAMMOGRAM  08/07/2018  . COLONOSCOPY  08/07/2018    Lab Results  Component Value Date   WBC 9.2 03/05/2014   HGB 11.1 (L) 03/05/2014   HCT 34.7 (L) 03/05/2014   MCV 87.4 03/05/2014   PLT 176 03/05/2014   Lab Results  Component Value Date   NA 142 03/05/2014   K 3.3 (L) 03/05/2014   CO2 28 03/05/2014   GLUCOSE 88 03/05/2014   BUN 10 03/05/2014   CREATININE 0.84 03/05/2014   BILITOT 0.2 (L) 03/05/2014   ALKPHOS 70 03/05/2014   AST 19 03/05/2014   ALT 19 03/05/2014   PROT 6.7 03/05/2014   ALBUMIN 3.4 (L) 03/05/2014   CALCIUM 8.6 03/05/2014   ANIONGAP 10 03/05/2014   Assessment & Plan:  1. Chronic obstructive pulmonary disease, unspecified COPD type (HCC) D/w pt to quit smoking - CBC w/Diff/Platelet - COMPLETE METABOLIC PANEL WITH GFR cxr 2. Tobacco dependence Cardiac risk d/w pt - Lipid panel  3. Elevated blood pressure reading bp readings at home - Lipid panel - TSH Start HCTZ daily-fasting labwork to establish baseline 4. EPIGASTRIC PAIN No medications currently  5. Chest pain in adult - EKG 12-Lead-SR - Ambulatory referral to  Cardiology Follow-up:   Myishia Kasik Mat Carne, MD

## 2019-09-09 NOTE — Patient Instructions (Addendum)
Chest xray labwork-fasting Cardiology referral Start taking HCTZ 12.5mg  Check blood pressure first thing in the morning If you have lab work done today you will be contacted with your lab results within the next 2 weeks.  If you have not heard from [place] then please contact us. The fastest way to get your results is to register for My Chart.   IF you received an x-ray today, you will receive an invoice from [facility]. Please contact [facility] at [phone] with questions or concerns regarding your invoice.   IF you received labwork today, you will receive an invoice from [facility]. Please contact [facility] at [phone] with questions or concerns regarding your invoice.   Our billing staff will not be able to assist you with questions regarding bills from these companies.  You will be contacted with the lab results as soon as they are available. The fastest way to get your results is to activate your My Chart account. Instructions are located on the last page of this paperwork. If you have not heard from [place] regarding the results in 2 weeks, please contact this office.

## 2019-09-13 DIAGNOSIS — R079 Chest pain, unspecified: Secondary | ICD-10-CM | POA: Insufficient documentation

## 2019-09-13 DIAGNOSIS — R030 Elevated blood-pressure reading, without diagnosis of hypertension: Secondary | ICD-10-CM | POA: Insufficient documentation

## 2019-09-13 HISTORY — DX: Chest pain, unspecified: R07.9

## 2019-09-29 ENCOUNTER — Encounter: Payer: Self-pay | Admitting: *Deleted

## 2019-09-30 ENCOUNTER — Ambulatory Visit: Payer: BC Managed Care – PPO | Admitting: Family Medicine

## 2019-09-30 ENCOUNTER — Ambulatory Visit: Payer: BC Managed Care – PPO | Admitting: Cardiology

## 2019-09-30 NOTE — Progress Notes (Deleted)
Cardiology Office Note  Date: 09/30/2019   ID: Lindsay, Mcmillan 1970/08/22, MRN [id number]  PCP:  Wandra Feinstein, MD  Consulting Cardiologist:  Nona Dell, MD Electrophysiologist:  None   No chief complaint on file.   History of Present Illness: Lindsay Mcmillan is a 49 y.o. female referred for cardiology consultation by Dr. Judee Clara for evaluation of chest pain.  Office note from March 3.  Past Medical History:  Diagnosis Date  . COPD (chronic obstructive pulmonary disease) (HCC)   . Tobacco dependence     Past Surgical History:  Procedure Laterality Date  . ABDOMINAL HYSTERECTOMY    . CESAREAN SECTION      Current Outpatient Medications  Medication Sig Dispense Refill  . acetaminophen (TYLENOL) 500 MG tablet Take 1,000 mg by mouth every 6 (six) hours as needed for mild pain or headache.    . albuterol (VENTOLIN HFA) 108 (90 Base) MCG/ACT inhaler Inhale 2 puffs into the lungs every 6 (six) hours as needed for wheezing or shortness of breath. 18 g 1  . hydrochlorothiazide (MICROZIDE) 12.5 MG capsule Take 1 capsule (12.5 mg total) by mouth daily. 30 capsule 0  . ibuprofen (ADVIL,MOTRIN) 200 MG tablet Take 800 mg by mouth every 6 (six) hours as needed for headache or moderate pain.     No current facility-administered medications for this visit.   Allergies:  Patient has no known allergies.   Social History: The patient  reports that she has been smoking cigarettes. She has a 30.00 pack-year smoking history. She has never used smokeless tobacco. She reports that she does not drink alcohol or use drugs.   Family History: The patient's family history includes Cancer in an other family member; Diabetes in an other family member.   ROS:  Please see the history of present illness. Otherwise, complete review of systems is positive for {NONE DEFAULTED:18576::"none"}.  All other systems are reviewed and negative.   Physical Exam: VS:  There were no vitals taken for this  visit., BMI There is no height or weight on file to calculate BMI.  Wt Readings from Last 3 Encounters:  09/09/19 186 lb 12.8 oz (84.7 kg)  08/11/19 185 lb (83.9 kg)  01/17/18 176 lb (79.8 kg)    General: Patient appears comfortable at rest. HEENT: Conjunctiva and lids normal, oropharynx clear with moist mucosa. Neck: Supple, no elevated JVP or carotid bruits, no thyromegaly. Lungs: Clear to auscultation, nonlabored breathing at rest. Cardiac: Regular rate and rhythm, no S3 or significant systolic murmur, no pericardial rub. Abdomen: Soft, nontender, no hepatomegaly, bowel sounds present, no guarding or rebound. Extremities: No pitting edema, distal pulses 2+. Skin: Warm and dry. Musculoskeletal: No kyphosis. Neuropsychiatric: Alert and oriented x3, affect grossly appropriate.  ECG:  An ECG dated 09/09/2019 was personally reviewed today and demonstrated:  Normal sinus rhythm.  Recent Labwork:  Follow-up lab work by Dr. Judee Clara is pending at this point.  Other Studies Reviewed Today:  No prior cardiac testing for review.  Assessment and Plan:    Medication Adjustments/Labs and Tests Ordered: Current medicines are reviewed at length with the patient today.  Concerns regarding medicines are outlined above.   Tests Ordered: No orders of the defined types were placed in this encounter.   Medication Changes: No orders of the defined types were placed in this encounter.   Disposition:  Follow up {follow up:15908}  Signed, Jonelle Sidle, MD, [facility] 09/30/2019 8:50 AM    [facility] at [place], [place], [place] [address] Phone: [phone]; Fax: [phone]

## 2019-10-05 DIAGNOSIS — Z79899 Other long term (current) drug therapy: Secondary | ICD-10-CM | POA: Diagnosis not present

## 2019-10-05 DIAGNOSIS — Z720 Tobacco use: Secondary | ICD-10-CM | POA: Diagnosis not present

## 2019-10-05 DIAGNOSIS — F1190 Opioid use, unspecified, uncomplicated: Secondary | ICD-10-CM | POA: Diagnosis not present

## 2019-10-05 DIAGNOSIS — Z0189 Encounter for other specified special examinations: Secondary | ICD-10-CM | POA: Diagnosis not present

## 2019-10-08 ENCOUNTER — Ambulatory Visit: Payer: BC Managed Care – PPO | Admitting: Family Medicine

## 2019-10-26 ENCOUNTER — Ambulatory Visit: Payer: BC Managed Care – PPO | Admitting: Cardiology

## 2019-10-28 ENCOUNTER — Ambulatory Visit (INDEPENDENT_AMBULATORY_CARE_PROVIDER_SITE_OTHER): Payer: BC Managed Care – PPO | Admitting: Cardiology

## 2019-10-28 ENCOUNTER — Encounter: Payer: Self-pay | Admitting: Cardiology

## 2019-10-28 ENCOUNTER — Other Ambulatory Visit: Payer: Self-pay

## 2019-10-28 VITALS — BP 142/78 | HR 62 | Ht 68.0 in | Wt 193.0 lb

## 2019-10-28 DIAGNOSIS — Z720 Tobacco use: Secondary | ICD-10-CM | POA: Diagnosis not present

## 2019-10-28 DIAGNOSIS — R0600 Dyspnea, unspecified: Secondary | ICD-10-CM

## 2019-10-28 DIAGNOSIS — R002 Palpitations: Secondary | ICD-10-CM

## 2019-10-28 DIAGNOSIS — J449 Chronic obstructive pulmonary disease, unspecified: Secondary | ICD-10-CM | POA: Diagnosis not present

## 2019-10-28 DIAGNOSIS — R0789 Other chest pain: Secondary | ICD-10-CM

## 2019-10-28 DIAGNOSIS — R0602 Shortness of breath: Secondary | ICD-10-CM

## 2019-10-28 DIAGNOSIS — R0609 Other forms of dyspnea: Secondary | ICD-10-CM

## 2019-10-28 MED ORDER — VARENICLINE TARTRATE 1 MG PO TABS: 1.0000 mg | ORAL_TABLET | Freq: Two times a day (BID) | ORAL | 2 refills | Status: DC

## 2019-10-28 MED ORDER — CHANTIX STARTING MONTH PAK 0.5 MG X 11 & 1 MG X 42 PO TABS: ORAL_TABLET | ORAL | 0 refills | Status: DC

## 2019-10-28 NOTE — Progress Notes (Signed)
Cardiology Office Note  Date: 10/28/2019   ID: Lindsay Mcmillan, DOB 1971/05/08, MRN [id number]  PCP:  Wandra Feinstein, MD  Cardiologist:  Nona Dell, MD Electrophysiologist:  None   Chief Complaint  Patient presents with  . Shortness of Breath    History of Present Illness: Lindsay Mcmillan is a 49 y.o. female referred for cardiology consultation by Dr. Judee Clara for the evaluation of chest pain and palpitations.  We discussed her symptoms today.  She actually describes several concerns that have been going on for several months.  She reports longstanding dyspnea on exertion and fatigue, worse in the last 7 or 8 months.  She does have episodic chest discomfort, this is sporadic and not precipitated by activity.  Also feels a sense of "butterflies" in her chest occasionally, no associated syncope.  She also reports leg swelling that fluctuates, not always better in the mornings when she has had her feet elevated overnight while sleeping.  She has been smoking since age 34.  She has tried to quit several times but has never had long-term success.  She tried patches most recently without improvement.  She has COPD/bronchitis at least based on previous plain film of the chest from 2015.  She has not undergone PFTs or had evaluation by pulmonologist.  She works as a Glass blower/designer for [employer]  I personally reviewed her recent ECG from March which was normal.  Past Medical History:  Diagnosis Date  . COPD (chronic obstructive pulmonary disease) (HCC)   . Tobacco dependence     Past Surgical History:  Procedure Laterality Date  . ABDOMINAL HYSTERECTOMY    . CESAREAN SECTION      Current Outpatient Medications  Medication Sig Dispense Refill  . acetaminophen (TYLENOL) 500 MG tablet Take 1,000 mg by mouth every 6 (six) hours as needed for mild pain or headache.    . albuterol (VENTOLIN HFA) 108 (90 Base) MCG/ACT inhaler Inhale 2 puffs into the lungs every 6 (six) hours as needed for  wheezing or shortness of breath. 18 g 1  . buprenorphine-naloxone (SUBOXONE) 8-2 mg SUBL SL tablet Place 1 tablet under the tongue daily.    . hydrochlorothiazide (MICROZIDE) 12.5 MG capsule Take 1 capsule (12.5 mg total) by mouth daily. 30 capsule 0  . ibuprofen (ADVIL,MOTRIN) 200 MG tablet Take 800 mg by mouth every 6 (six) hours as needed for headache or moderate pain.    . varenicline (CHANTIX CONTINUING MONTH PAK) 1 MG tablet Take 1 tablet (1 mg total) by mouth 2 (two) times daily. 60 tablet 2  . varenicline (CHANTIX STARTING MONTH PAK) 0.5 MG X 11 & 1 MG X 42 tablet Take one 0.5 mg tablet by mouth once daily for 3 days, then increase to one 0.5 mg tablet twice daily for 4 days, then increase to one 1 mg tablet twice daily. 53 tablet 0   No current facility-administered medications for this visit.   Allergies:  Patient has no known allergies.   Social History: The patient  reports that she has been smoking cigarettes. She has a 30.00 pack-year smoking history. She has never used smokeless tobacco. She reports that she does not drink alcohol or use drugs.   Family History: The patient's family history includes Cancer in an other family member; Diabetes in an other family member.   ROS:  Intermittent wheezing and chest congestion.  Physical Exam: VS:  BP (!) 142/78   Pulse 62   Ht 5\' 8"  (1.727 m)  Wt 193 lb (87.5 kg)   SpO2 95%   BMI 29.35 kg/m , BMI Body mass index is 29.35 kg/m.  Wt Readings from Last 3 Encounters:  10/28/19 193 lb (87.5 kg)  09/09/19 186 lb 12.8 oz (84.7 kg)  08/11/19 185 lb (83.9 kg)    General: Patient appears comfortable at rest. HEENT: Conjunctiva and lids normal, wearing a mask.. Neck: Supple, no elevated JVP or carotid bruits, no thyromegaly. Lungs: Decreased breath sounds without active wheezing, nonlabored breathing at rest. Cardiac: Regular rate and rhythm, no S3 or significant systolic murmur. Abdomen: Soft, bowel sounds present. Extremities:  Trace bilateral ankle edema, distal pulses 2+. Skin: Warm and dry. Musculoskeletal: No kyphosis. Neuropsychiatric: Alert and oriented x3, affect grossly appropriate.  ECG:  An ECG dated 09/09/2019 was personally reviewed today and demonstrated:  Normal sinus rhythm.  Recent Labwork:  No recent lab work available for review.  Other Studies Reviewed Today:  Chest x-ray 03/05/2014: FINDINGS: Normal sized heart. Clear lungs. The lungs are hyperexpanded with diffuse peribronchial thickening. Unremarkable bones.  IMPRESSION: No acute abnormality.  Changes of COPD and chronic bronchitis.  Assessment and Plan:  1.  Longstanding dyspnea on exertion in the setting of long-term tobacco abuse and suspected COPD/bronchitis based on chest x-ray from 2015.  She does report intermittent chest pain, not specifically exertional however, recent ECG in March was normal.  Reports intermittent fluttering sensation and also leg swelling as discussed above.  We attempted to have her walk in the office with pulse oximeter, however did not get accurate readings due to nail polish in place.  Plan is to proceed with full PFTs, noncontrasted chest CT, and an echocardiogram.  Further recommendations to follow.  2.  Tobacco abuse.  We discussed smoking cessation strategies today.  She did not tolerate nicotine patches.  She is willing to try Chantix.   Medication Adjustments/Labs and Tests Ordered: Current medicines are reviewed at length with the patient today.  Concerns regarding medicines are outlined above.   Tests Ordered: Orders Placed This Encounter  Procedures  . CT Chest Wo Contrast  . ECHOCARDIOGRAM COMPLETE  . Pulmonary Function Test    Medication Changes: Meds ordered this encounter  Medications  . varenicline (CHANTIX STARTING MONTH PAK) 0.5 MG X 11 & 1 MG X 42 tablet    Sig: Take one 0.5 mg tablet by mouth once daily for 3 days, then increase to one 0.5 mg tablet twice daily for 4 days, then  increase to one 1 mg tablet twice daily.    Dispense:  53 tablet    Refill:  0  . varenicline (CHANTIX CONTINUING MONTH PAK) 1 MG tablet    Sig: Take 1 tablet (1 mg total) by mouth 2 (two) times daily.    Dispense:  60 tablet    Refill:  2    Disposition:  Follow up test results and determine next step.  Signed, Satira Sark, MD, [facility] 10/28/2019 11:24 AM    [facility] at [place], [place], [place] [address] Phone: [phone]; Fax: [phone]

## 2019-10-28 NOTE — Patient Instructions (Addendum)
Medication Instructions:   Chantix starter kit - sent to pharmacy today.  Continue all other medications.    Labwork: none  Testing/Procedures:  Your physician has recommended that you have a pulmonary function test. Pulmonary Function Tests are a group of tests that measure how well air moves in and out of your lungs - you will need covid screen prior to this test.   Non-contrast chest CT  Your physician has requested that you have an echocardiogram. Echocardiography is a painless test that uses sound waves to create images of your heart. It provides your doctor with information about the size and shape of your heart and how well your heart's chambers and valves are working. This procedure takes approximately one hour. There are no restrictions for this procedure.  Follow-Up: Pending test results   Any Other Special Instructions Will Be Listed Below (If Applicable).  If you need a refill on your cardiac medications before your next appointment, please call your pharmacy.

## 2019-11-02 DIAGNOSIS — F4310 Post-traumatic stress disorder, unspecified: Secondary | ICD-10-CM | POA: Diagnosis not present

## 2019-11-02 DIAGNOSIS — F339 Major depressive disorder, recurrent, unspecified: Secondary | ICD-10-CM | POA: Diagnosis not present

## 2019-11-02 DIAGNOSIS — F411 Generalized anxiety disorder: Secondary | ICD-10-CM | POA: Diagnosis not present

## 2019-11-13 ENCOUNTER — Other Ambulatory Visit: Payer: Self-pay

## 2019-11-13 ENCOUNTER — Other Ambulatory Visit (HOSPITAL_COMMUNITY)
Admission: RE | Admit: 2019-11-13 | Discharge: 2019-11-13 | Disposition: A | Discharge status: Home / Self Care | Payer: BC Managed Care – PPO | Source: Ambulatory Visit | Attending: Cardiology | Admitting: Cardiology

## 2019-11-13 DIAGNOSIS — Z01812 Encounter for preprocedural laboratory examination: Secondary | ICD-10-CM | POA: Diagnosis not present

## 2019-11-13 DIAGNOSIS — Z20822 Contact with and (suspected) exposure to covid-19: Secondary | ICD-10-CM | POA: Insufficient documentation

## 2019-11-14 LAB — SARS CORONAVIRUS 2 (TAT 6-24 HRS): SARS Coronavirus 2: NEGATIVE

## 2019-11-17 ENCOUNTER — Ambulatory Visit (HOSPITAL_COMMUNITY)
Admission: RE | Admit: 2019-11-17 | Discharge: 2019-11-17 | Disposition: A | Discharge status: Home / Self Care | Payer: BC Managed Care – PPO | Source: Ambulatory Visit | Attending: Cardiology | Admitting: Cardiology

## 2019-11-17 ENCOUNTER — Other Ambulatory Visit: Payer: Self-pay

## 2019-11-17 DIAGNOSIS — J449 Chronic obstructive pulmonary disease, unspecified: Secondary | ICD-10-CM

## 2019-11-17 DIAGNOSIS — R0602 Shortness of breath: Secondary | ICD-10-CM

## 2019-11-17 LAB — PULMONARY FUNCTION TEST
FEV1-%Change-Post: 3 %
FEV6FVC-%Pred-Post: 98 %

## 2019-11-17 MED ORDER — ALBUTEROL SULFATE (2.5 MG/3ML) 0.083% IN NEBU
2.5000 mg | INHALATION_SOLUTION | Freq: Once | RESPIRATORY_TRACT | Status: AC
  Administered 2019-11-17: 08:00:00 2.5 mg via RESPIRATORY_TRACT

## 2019-11-17 NOTE — Progress Notes (Signed)
*  PRELIMINARY RESULTS* Echocardiogram 2D Echocardiogram has been performed.  Stacey Drain 11/17/2019, 9:56 AM

## 2019-11-19 ENCOUNTER — Telehealth: Payer: Self-pay | Admitting: Internal Medicine

## 2019-11-19 ENCOUNTER — Telehealth: Payer: Self-pay | Admitting: *Deleted

## 2019-11-19 DIAGNOSIS — F4310 Post-traumatic stress disorder, unspecified: Secondary | ICD-10-CM | POA: Diagnosis not present

## 2019-11-19 DIAGNOSIS — F339 Major depressive disorder, recurrent, unspecified: Secondary | ICD-10-CM | POA: Diagnosis not present

## 2019-11-19 DIAGNOSIS — J449 Chronic obstructive pulmonary disease, unspecified: Secondary | ICD-10-CM

## 2019-11-19 DIAGNOSIS — F411 Generalized anxiety disorder: Secondary | ICD-10-CM | POA: Diagnosis not present

## 2019-11-19 LAB — PULMONARY FUNCTION TEST
DL/VA % pred: 57 %
DL/VA: 2.4 ml/min/mmHg/L
DLCO unc % pred: 53 %
DLCO unc: 12.74 ml/min/mmHg
FEF 25-75 Post: 1.03 L/sec
FEF 25-75 Pre: 1.01 L/sec
FEF2575-%Change-Post: 1 %
FEF2575-%Pred-Post: 34 %
FEF2575-%Pred-Pre: 34 %
FEV1-%Pred-Post: 68 %
FEV1-%Pred-Pre: 65 %
FEV1-Post: 2.16 L
FEV1-Pre: 2.09 L
FEV1FVC-%Change-Post: 4 %
FEV1FVC-%Pred-Pre: 75 %
FEV6-%Change-Post: 0 %
FEV6-%Pred-Post: 83 %
FEV6-%Pred-Pre: 82 %
FEV6-Post: 3.26 L
FEV6-Pre: 3.24 L
FEV6FVC-%Change-Post: 1 %
FEV6FVC-%Pred-Pre: 96 %
FVC-%Change-Post: -1 %
FVC-%Pred-Post: 84 %
FVC-%Pred-Pre: 85 %
FVC-Post: 3.4 L
FVC-Pre: 3.45 L
Post FEV1/FVC ratio: 63 %
Post FEV6/FVC ratio: 96 %
Pre FEV1/FVC ratio: 61 %
Pre FEV6/FVC Ratio: 94 %
RV % pred: 150 %
RV: 3.02 L
TLC % pred: 112 %
TLC: 6.34 L

## 2019-11-19 NOTE — Telephone Encounter (Signed)
Patient informed and verbalized understanding of plan. Copy sent to PCP 

## 2019-11-19 NOTE — Telephone Encounter (Signed)
-----   Message from Samuel G McDowell, MD sent at 11/19/2019  4:02 PM EDT ----- Results reviewed.  PFTs consistent with emphysema with moderate airway obstruction and moderately severe diffusion defect.  I would have her referred to [facility] in [place] for further management. 

## 2019-11-19 NOTE — Telephone Encounter (Signed)
Patient informed. Copy sent to PCP °

## 2019-11-19 NOTE — Telephone Encounter (Signed)
Dr. Diona Browner would like to know if the PFT can be finalized. MW please advise.

## 2019-11-19 NOTE — Telephone Encounter (Signed)
-----   Message from Jonelle Sidle, MD sent at 11/17/2019 11:01 AM EDT ----- Results reviewed.  Echocardiogram overall reassuring, normal LVEF and RV contraction.  No major valvular abnormalities.  Await results of PFTs and chest CT.

## 2019-11-19 NOTE — Telephone Encounter (Signed)
Spoke with Dr. Ival Bible office, aware of recs.  Nothing further needed at this time- will close encounter.

## 2019-11-19 NOTE — Telephone Encounter (Signed)
-----   Message from Jonelle Sidle, MD sent at 11/19/2019  4:02 PM EDT ----- Results reviewed.  PFTs consistent with emphysema with moderate airway obstruction and moderately severe diffusion defect.  I would have her referred to [facility] in [place] for further management.

## 2019-11-19 NOTE — Telephone Encounter (Signed)
Done

## 2019-12-31 ENCOUNTER — Institutional Professional Consult (permissible substitution): Payer: BC Managed Care – PPO | Admitting: Pulmonary Disease

## 2020-04-20 ENCOUNTER — Ambulatory Visit (INDEPENDENT_AMBULATORY_CARE_PROVIDER_SITE_OTHER): Payer: Self-pay | Admitting: Family Medicine

## 2020-04-20 ENCOUNTER — Other Ambulatory Visit: Payer: Self-pay

## 2020-04-20 ENCOUNTER — Encounter: Payer: Self-pay | Admitting: Family Medicine

## 2020-04-20 VITALS — BP 134/82 | HR 88 | Ht 68.5 in | Wt 185.0 lb

## 2020-04-20 DIAGNOSIS — F32A Depression, unspecified: Secondary | ICD-10-CM

## 2020-04-20 DIAGNOSIS — Z720 Tobacco use: Secondary | ICD-10-CM | POA: Insufficient documentation

## 2020-04-20 DIAGNOSIS — Z818 Family history of other mental and behavioral disorders: Secondary | ICD-10-CM

## 2020-04-20 DIAGNOSIS — R0683 Snoring: Secondary | ICD-10-CM | POA: Insufficient documentation

## 2020-04-20 DIAGNOSIS — J449 Chronic obstructive pulmonary disease, unspecified: Secondary | ICD-10-CM

## 2020-04-20 DIAGNOSIS — Z23 Encounter for immunization: Secondary | ICD-10-CM | POA: Insufficient documentation

## 2020-04-20 DIAGNOSIS — F17210 Nicotine dependence, cigarettes, uncomplicated: Secondary | ICD-10-CM | POA: Insufficient documentation

## 2020-04-20 DIAGNOSIS — I10 Essential (primary) hypertension: Secondary | ICD-10-CM | POA: Insufficient documentation

## 2020-04-20 DIAGNOSIS — G479 Sleep disorder, unspecified: Secondary | ICD-10-CM

## 2020-04-20 MED ORDER — LISINOPRIL-HYDROCHLOROTHIAZIDE 10-12.5 MG PO TABS: 1.0000 | ORAL_TABLET | Freq: Every day | ORAL | 2 refills | Status: DC

## 2020-04-20 MED ORDER — NICOTINE 21 MG/24HR TD PT24: 21.0000 mg | MEDICATED_PATCH | Freq: Every day | TRANSDERMAL | 0 refills | Status: DC

## 2020-04-20 NOTE — Patient Instructions (Signed)
°  HAPPY FALL!  I appreciate the opportunity to provide you with care for your health and wellness. Today we discussed: established care   Follow up: 6-8 weeks   No labs  Referrals today: eval for bipolar, neurologist for sleep study  Start combo pill lisinopril and HTCZ   Start Nicoderm Patches   Please continue to practice social distancing to keep you, your family, and our community safe.  If you must go out, please wear a mask and practice good handwashing.  It was a pleasure to see you and I look forward to continuing to work together on your health and well-being. Please do not hesitate to call the office if you need care or have questions about your care.  Have a wonderful day and week. With Gratitude, Tereasa Coop, DNP, AGNP-BC

## 2020-04-20 NOTE — Assessment & Plan Note (Signed)
NicoDerm patches provided.  Advised for her to get follow-up with the provider that she was seeing prior that told her that she had COPD.  Strong history of smoking right now 2 packs a day.  She does have motivation to quit.

## 2020-04-20 NOTE — Assessment & Plan Note (Signed)
Blood pressure noted to be controlled.  However given that she does have numbers that they check at work I will add lisinopril for combination pill with hydrochlorothiazide and have close follow-up.  I have encouraged DASH diet, smoking cessation, exercise. Patient acknowledged agreement and understanding of the plan.   Reviewed side effects, risks and benefits of medication.

## 2020-04-20 NOTE — Assessment & Plan Note (Signed)
Denies having any SI or HI in the office.  However she reports that she feels depressed at times and can cry very easily.  She also reports that she has strong family history of bipolar disorder and sometimes she feels like she might be the same.  She is willing to see a psychiatrist for proper evaluation and treatment.  Referral made.

## 2020-04-20 NOTE — Assessment & Plan Note (Signed)

## 2020-04-20 NOTE — Assessment & Plan Note (Signed)
Asked about quitting: confirms they are currently smokes cigarettes Advise to quit smoking: Educated about QUITTING to reduce the risk of cancer, cardio and cerebrovascular disease. Assess willingness: Unwilling to quit at this time, but is working on cutting back. Assist with counseling and pharmacotherapy: Counseled for 5 minutes and literature provided. Arrange for follow up: wanting to quitting follow up in 3 months and continue to offer help.   Patches provided Is smoking 2 pks a day

## 2020-04-20 NOTE — Assessment & Plan Note (Signed)
Snoring with questionable apnea.  Sent to neurology for sleep study needs.

## 2020-04-20 NOTE — Progress Notes (Signed)
Subjective:  Patient ID: Lindsay Mcmillan, female    DOB: Feb 14, 1971  Age: 49 y.o. MRN: [id number]  CC:  Chief Complaint  Patient presents with  . New Patient (Initial Visit)    discuss high blood pressure, has been falling asleep at work for 1.5+ years, was falling asleep behind the wheel but that has resolved, since Hysterectomy 12 yrs ago has no sex drive      HPI  HPI  Lindsay Mcmillan is a 49 year old female patient who presents today to establish care.  Previously seen by Dr Judee Clara.  She reports that she lost her insurance and is just recently got it back and so she is getting back in for care.  She has a significant history that includes but is not limited to COPD, tobacco dependence, history of substance abuse is on Suboxone, questionable bipolar versus depression has never received treatment for this, uncontrollable sleepiness, high blood pressure.  Her biggest concerns today are that her blood pressure is still high especially at work.  She reports numbers of 213/107, 196/104.  She reports she has been taking the hydrochlorothiazide medication that she was put on but overall there is not been much of an improvement.  She is willing to have an adjustment to this medication.  She does report that she does have headaches, blurred vision, dizziness and at times chest pain when the blood pressure is elevated.  She denies having any active chest pain today in the office.  Blood pressure is within normal range.  She reports that she falls asleep at work very easily.  Even when she is standing.  She reports that she has fallen asleep in her car.  She does snore a lot at night.  She is unsure what is causing this.  She reports that she is worried about this.  And wanted know what she can do about it.  She is willing to see a neurologist for possible sleep study needs.  She reports that her sex drive has been poor since she had a hysterectomy 12 years ago.  She reports a total hysterectomy with  ovary removal.  But she also reports that she has a strong family history of bipolar/manic depression.  She reports several family members who have had to have treatment with lithium.  She also reports significant depression which is one of the reasons why she thinks she was hooked on cocaine and pain medicine.  She would like to get treatment for this to see if there is something that she can do of benefit because she would like to 1 day come off the Suboxone.  She smokes 2 packs a day.  Wants to quit.  Has tried Chantix but that made depression worse.  Has tried the patches but the generic did not work for her however the brand-name patches that her significant other use did work for her and she would like to know if that something that we can order for her today.   Today patient denies signs and symptoms of COVID 19 infection including fever, chills, cough, shortness of breath, and headache. Past Medical, Surgical, Social History, Allergies, and Medications have been Reviewed.   Past Medical History:  Diagnosis Date  . BOILS, RECURRENT 10/10/2009   Qualifier: Diagnosis of  By: De Blanch.   . Chest pain in adult 09/13/2019  . COPD (chronic obstructive pulmonary disease) (HCC)   . Encounter for screening mammogram for malignant neoplasm of breast 08/11/2019  .  Tobacco dependence     Current Meds  Medication Sig  . acetaminophen (TYLENOL) 500 MG tablet Take 1,000 mg by mouth every 6 (six) hours as needed for mild pain or headache.  . albuterol (VENTOLIN HFA) 108 (90 Base) MCG/ACT inhaler Inhale 2 puffs into the lungs every 6 (six) hours as needed for wheezing or shortness of breath.  . buprenorphine-naloxone (SUBOXONE) 8-2 mg SUBL SL tablet Place 1 tablet under the tongue daily.  Marland Kitchen ibuprofen (ADVIL,MOTRIN) 200 MG tablet Take 800 mg by mouth every 6 (six) hours as needed for headache or moderate pain.  . [DISCONTINUED] hydrochlorothiazide (MICROZIDE) 12.5 MG capsule Take 1 capsule (12.5  mg total) by mouth daily.    ROS:  Review of Systems  Constitutional: Negative.   HENT: Negative.   Eyes: Positive for blurred vision.  Respiratory: Negative.   Gastrointestinal: Negative.   Genitourinary: Negative.   Musculoskeletal: Negative.   Skin: Negative.   Neurological: Positive for dizziness and headaches.  Endo/Heme/Allergies: Negative.   Psychiatric/Behavioral: Positive for depression.     Objective:   Today's Vitals: BP 134/82 (BP Location: Right Arm, Patient Position: Sitting, Cuff Size: Normal)   Pulse 88   Ht 5' 8.5" (1.74 m)   Wt 185 lb (83.9 kg)   SpO2 94%   BMI 27.72 kg/m  Vitals with BMI 04/20/2020 10/28/2019 09/09/2019  Height 5' 8.5" 5\' 8"  5' 8.5"  Weight 185 lbs 193 lbs 186 lbs 13 oz  BMI 27.72 29.35 27.99  Systolic 134 142  Diastolic 82 78 80  Pulse 88 62 69     Physical Exam Vitals and nursing note reviewed.  Constitutional:      Appearance: Normal appearance. She is well-developed, well-groomed and overweight.  HENT:     Head: Normocephalic and atraumatic.     Right Ear: External ear normal.     Left Ear: External ear normal.     Mouth/Throat:     Comments: Mask in place Eyes:     General:        Right eye: No discharge.        Left eye: No discharge.     Conjunctiva/sclera: Conjunctivae normal.     Comments: Glasses  Cardiovascular:     Rate and Rhythm: Normal rate and regular rhythm.     Pulses: Normal pulses.     Heart sounds: Normal heart sounds.  Pulmonary:     Effort: Pulmonary effort is normal.     Breath sounds: Normal breath sounds.  Musculoskeletal:        General: Normal range of motion.     Cervical back: Normal range of motion and neck supple.  Skin:    General: Skin is warm.  Neurological:     General: No focal deficit present.     Mental Status: She is alert and oriented to person, place, and time.  Psychiatric:        Attention and Perception: Attention normal.        Mood and Affect: Mood normal.         Speech: Speech normal.        Behavior: Behavior normal. Behavior is cooperative.        Thought Content: Thought content normal.        Cognition and Memory: Cognition normal.        Judgment: Judgment normal.     Assessment   1. Chronic obstructive pulmonary disease, unspecified COPD type (HCC)   2. Essential hypertension   3.  Cigarette nicotine dependence without complication   4. Snoring   5. Depression, unspecified depression type   6. Family history of bipolar disorder   7. Need for immunization against influenza   8. Disturbance of sleep     Tests ordered Orders Placed This Encounter  Procedures  . Flu Vaccine QUAD 36+ mos IM  . Ambulatory referral to Neurology  . Ambulatory referral to Behavioral Health     Plan: Please see assessment and plan per problem list above.   Meds ordered this encounter  Medications  . lisinopril-hydrochlorothiazide (ZESTORETIC) 10-12.5 MG tablet    Sig: Take 1 tablet by mouth daily.    Dispense:  30 tablet    Refill:  2    Order Specific Question:   Supervising Provider    Answer:   SIMPSON, MARGARET E [[id number]]  . nicotine (NICODERM CQ) 21 mg/24hr patch    Sig: Place 1 patch (21 mg total) onto the skin daily.    Dispense:  28 patch    Refill:  0    Please use on brand name, unable to tolerate generic    Order Specific Question:   Supervising Provider    Answer:   Kerri Perches [[id number]]    Patient to follow-up in 06/15/2020  Note: This dictation was prepared with Dragon dictation along with smaller phrase technology. Similar sounding words can be transcribed inadequately or may not be corrected upon review. Any transcriptional errors that result from this process are unintentional.      Freddy Finner, NP

## 2020-04-25 ENCOUNTER — Telehealth: Payer: Self-pay | Admitting: Cardiology

## 2020-04-25 DIAGNOSIS — J449 Chronic obstructive pulmonary disease, unspecified: Secondary | ICD-10-CM

## 2020-04-25 NOTE — Telephone Encounter (Signed)
New referral entered today.

## 2020-04-25 NOTE — Telephone Encounter (Signed)
Pt is needing a new referral placed for LBP- she lost her job and her ins and was not able to set up an apt with the previous referral

## 2020-05-11 ENCOUNTER — Telehealth: Payer: Self-pay

## 2020-05-11 ENCOUNTER — Telehealth: Payer: Medicaid Other | Admitting: Family Medicine

## 2020-05-11 ENCOUNTER — Other Ambulatory Visit: Payer: Self-pay

## 2020-05-11 NOTE — Telephone Encounter (Signed)
Work note done, pt may have someone else come pick this up depending on the results. She will have them fax over report.

## 2020-05-11 NOTE — Telephone Encounter (Signed)
Pt wants to know if she could get an out of note for work for last night and tonight or until her covid swab comes back? She is going to her suboxone clinic now and they are going to covid swab her before hand because that is there protocol. She wanted you to know that she left work last night and went to the ER but they were so busy that she left and that is why she made the appt with you today. Please advise.

## 2020-05-11 NOTE — Telephone Encounter (Signed)
Yes, please provide one for her. And please let her know we would like a copy of the report so we can scan it. Thank you

## 2020-05-16 ENCOUNTER — Ambulatory Visit (INDEPENDENT_AMBULATORY_CARE_PROVIDER_SITE_OTHER): Payer: BC Managed Care – PPO | Admitting: Internal Medicine

## 2020-05-16 ENCOUNTER — Other Ambulatory Visit: Payer: Self-pay

## 2020-05-16 ENCOUNTER — Encounter: Payer: Self-pay | Admitting: Internal Medicine

## 2020-05-16 VITALS — BP 107/72 | HR 87 | Temp 98.1°F | Resp 18 | Ht 68.0 in | Wt 182.1 lb

## 2020-05-16 DIAGNOSIS — E559 Vitamin D deficiency, unspecified: Secondary | ICD-10-CM

## 2020-05-16 DIAGNOSIS — J449 Chronic obstructive pulmonary disease, unspecified: Secondary | ICD-10-CM

## 2020-05-16 DIAGNOSIS — Z1231 Encounter for screening mammogram for malignant neoplasm of breast: Secondary | ICD-10-CM

## 2020-05-16 DIAGNOSIS — F32A Depression, unspecified: Secondary | ICD-10-CM

## 2020-05-16 DIAGNOSIS — R42 Dizziness and giddiness: Secondary | ICD-10-CM

## 2020-05-16 DIAGNOSIS — I10 Essential (primary) hypertension: Secondary | ICD-10-CM | POA: Diagnosis not present

## 2020-05-16 DIAGNOSIS — Z1211 Encounter for screening for malignant neoplasm of colon: Secondary | ICD-10-CM

## 2020-05-16 NOTE — Assessment & Plan Note (Signed)
Uses Albuterol inhaler PRN 

## 2020-05-16 NOTE — Patient Instructions (Addendum)
Please avoid sudden positional changes or intense emotional states.  Please take Meclizine 12.5 mg up to twice daily as needed for dizziness.  Please try to quit smoking as needed.  Please start taking over-the-counter multivitamin for women.  Please follow up with Psychiatrist as scheduled.  You are being referred to GI for colonoscopy.  You are being scheduled for Mammography.

## 2020-05-16 NOTE — Progress Notes (Signed)
Established Patient Office Visit  Subjective:  Patient ID: Lindsay Mcmillan, female    DOB: 1971-01-20  Age: 49 y.o. MRN: [id number]  CC:  Chief Complaint  Patient presents with  . Acute Visit    Pt was seen 10-13 by Jarrett Soho she is not feeling better she keeps having to leave work because she gets light headed and dizzy and her arms feel heavy this is no better     HPI Lindsay Mcmillan is a 49 year old female with past medical history of hypertension, COPD, depression and tobacco abuse presents for follow-up of chronic medical conditions.  She states that she has been having dizziness spells intermittently.  She describes the sensation as dark and blurry vision associated with current like sensation from her left arm to right arm.  She denies any chest pain, dyspnea or palpitations.  She has noted that dizziness happens most often when she is standing. She has had such episodes while at work, and had to leave from work after that.  She is worried about losing her job because of this.  She was referred to psychiatrist for evaluation of depression in the last visit.  She has not received any call from psychiatric office for an appointment.  Past Medical History:  Diagnosis Date  . BOILS, RECURRENT 10/10/2009   Qualifier: Diagnosis of  By: Westly Pam.   . Chest pain in adult 09/13/2019  . COPD (chronic obstructive pulmonary disease) ([place])   . Encounter for screening mammogram for malignant neoplasm of breast 08/11/2019  . Tobacco dependence     Past Surgical History:  Procedure Laterality Date  . ABDOMINAL HYSTERECTOMY    . CESAREAN SECTION      Family History  Problem Relation Age of Onset  . Diabetes Other   . Cancer Other     Social History   Socioeconomic History  . Marital status: Single    Spouse name: Not on file  . Number of children: Not on file  . Years of education: Not on file  . Highest education level: Not on file  Occupational History  . Not on  file  Tobacco Use  . Smoking status: Current Every Day Smoker    Packs/day: 2.00    Years: 30.00    Pack years: 60.00    Types: Cigarettes  . Smokeless tobacco: Never Used  Vaping Use  . Vaping Use: Former  Substance and Sexual Activity  . Alcohol use: No  . Drug use: No  . Sexual activity: Yes    Birth control/protection: None  Other Topics Concern  . 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 Difficulty of Paying Living Expenses: Not hard at all  Food Insecurity: No Food Insecurity  . Worried About Charity fundraiser in the Last Year: Never true  . Ran Out of Food in the Last Year: Never true  Transportation Needs: No Transportation Needs  . Lack of Transportation (Medical): No  . Lack of  Transportation (Non-Medical): No  Physical Activity: Sufficiently Active  . Days of Exercise per Week: 4 days  . Minutes of Exercise per Session: 60 min  Stress: Stress Concern Present  . Feeling of Stress : Very much  Social Connections: Moderately Integrated  . Frequency of Communication with Friends and Family: More than three times a week  . Frequency of Social Gatherings with Friends and Family: Once a week  . Attends Religious Services: More than 4 times per year  . Active Member of Clubs or Organizations: No  . Attends Archivist Meetings: Never  . Marital Status: Living with partner  Intimate Partner Violence: Not At Risk  . Fear of Current or Ex-Partner: No  . Emotionally Abused: No  . Physically Abused: No  . Sexually Abused: No    Outpatient Medications Prior to Visit  Medication Sig Dispense Refill  . acetaminophen (TYLENOL)  500 MG tablet Take 1,000 mg by mouth every 6 (six) hours as needed for mild pain or headache.    . albuterol (VENTOLIN HFA) 108 (90 Base) MCG/ACT inhaler Inhale 2 puffs into the lungs every 6 (six) hours as needed for wheezing or shortness of breath. 18 g 1  . buprenorphine-naloxone (SUBOXONE) 8-2 mg SUBL SL tablet Place 1 tablet under the tongue daily.    Marland Kitchen ibuprofen (ADVIL,MOTRIN) 200 MG tablet Take 800 mg by mouth every 6 (six) hours as needed for headache or moderate pain.    Marland Kitchen lisinopril-hydrochlorothiazide (ZESTORETIC) 10-12.5 MG tablet Take 1 tablet by mouth daily. 30 tablet 2  . nicotine (NICODERM CQ) 21 mg/24hr patch Place 1 patch (21 mg total) onto the skin daily. (Patient not taking: Reported on 05/16/2020) 28 patch 0   No facility-administered medications prior to visit.    No Known Allergies  ROS Review of Systems  Constitutional: Negative for chills and fever.  HENT: Negative for congestion, sinus pressure, sinus pain and sore throat.   Eyes: Negative for pain and discharge.  Respiratory: Negative for cough and shortness of breath.   Cardiovascular: Negative for chest pain and palpitations.  Gastrointestinal: Negative for abdominal pain, constipation, diarrhea, nausea and vomiting.  Endocrine: Negative for polydipsia and polyuria.  Genitourinary: Negative for dysuria and hematuria.  Musculoskeletal: Negative for neck pain and neck stiffness.  Skin: Negative for rash.  Neurological: Positive for dizziness. Negative for seizures, syncope and headaches.  Psychiatric/Behavioral: Positive for dysphoric mood. Negative for agitation, behavioral problems and suicidal ideas. The patient is nervous/anxious.       Objective:    Physical Exam Vitals reviewed.  Constitutional:      General: She is not in acute distress.    Appearance: She is not diaphoretic.  HENT:     Head: Normocephalic and atraumatic.     Nose: Nose normal. No congestion.     Mouth/Throat:     Mouth: Mucous  membranes are moist.     Pharynx: No posterior oropharyngeal erythema.  Eyes:     General: No scleral icterus.    Extraocular Movements: Extraocular movements intact.     Pupils: Pupils are equal, round, and reactive to light.  Cardiovascular:     Rate and Rhythm: Normal rate and regular rhythm.     Pulses: Normal pulses.     Heart sounds: Normal heart sounds. No murmur heard.   Pulmonary:     Breath sounds: Normal breath sounds. No wheezing or rales.  Abdominal:     Palpations: Abdomen is soft.     Tenderness: There  is no abdominal tenderness.  Musculoskeletal:     Cervical back: Neck supple. No tenderness.     Right lower leg: No edema.     Left lower leg: No edema.  Skin:    General: Skin is warm.     Findings: No rash.  Neurological:     General: No focal deficit present.     Mental Status: She is alert and oriented to person, place, and time.     Sensory: No sensory deficit.     Motor: No weakness.  Psychiatric:        Mood and Affect: Mood normal.        Behavior: Behavior normal.     BP 107/72 (BP Location: Right Arm, Patient Position: Sitting, Cuff Size: Normal)   Pulse 87   Temp 98.1 F (36.7 C) (Oral)   Resp 18   Ht '5\' 8"'  (1.727 m)   Wt 182 lb 1.9 oz (82.6 kg)   SpO2 95%   BMI 27.69 kg/m  Wt Readings from Last 3 Encounters:  05/16/20 182 lb 1.9 oz (82.6 kg)  04/20/20 185 lb (83.9 kg)  10/28/19 193 lb (87.5 kg)     Health Maintenance Due  Topic Date Due  . Hepatitis C Screening  Never done  . PAP SMEAR-Modifier  Never done  . MAMMOGRAM  Never done  . COLONOSCOPY  Never done    There are no preventive care reminders to display for this patient.  No results found for: TSH Lab Results  Component Value Date   WBC 9.2 03/05/2014   HGB 11.1 (L) 03/05/2014   HCT 34.7 (L) 03/05/2014   MCV 87.4 03/05/2014   PLT 176 03/05/2014   Lab Results  Component Value Date   NA 142 03/05/2014   K 3.3 (L) 03/05/2014   CO2 28 03/05/2014   GLUCOSE 88  03/05/2014   BUN 10 03/05/2014   CREATININE 0.84 03/05/2014   BILITOT 0.2 (L) 03/05/2014   ALKPHOS 70 03/05/2014   AST 19 03/05/2014   ALT 19 03/05/2014   PROT 6.7 03/05/2014   ALBUMIN 3.4 (L) 03/05/2014   CALCIUM 8.6 03/05/2014   ANIONGAP 10 03/05/2014   No results found for: CHOL No results found for: HDL No results found for: LDLCALC No results found for: TRIG No results found for: CHOLHDL No results found for: HGBA1C    Assessment & Plan:   Problem List Items Addressed This Visit      Cardiovascular and Mediastinum   Essential hypertension    BP Readings from Last 1 Encounters:  05/16/20 107/72   Well-controlled with Lisinopril-HCTZ Counseled for compliance with the medications Advised DASH diet and moderate exercise/walking, at least 150 mins/week       Relevant Orders   CBC with Differential   CMP14+EGFR   Lipid Profile     Respiratory   Chronic obstructive pulmonary disease ([place])    Uses Albuterol inhaler PRN        Other   Depression    Referred to Psychiatry Bupropion a good agent considering her smoking history      Relevant Orders   Vitamin D (25 hydroxy)   Dizziness - Primary    Likely in the setting of orthostatic hypotension vs vertigo Increase fluid intake Advised to avoid sudden positional changes Meclizine PRN Check CMP and magnesium      Relevant Orders   CMP14+EGFR   Magnesium    Other Visit Diagnoses    Special screening for malignant neoplasms,  colon       Relevant Orders   Ambulatory referral to Gastroenterology   Encounter for mammogram to establish baseline mammogram       Relevant Orders   MM Digital Screening      No orders of the defined types were placed in this encounter.   Follow-up: Return in about 6 weeks (around 06/27/2020).    Lindell Spar, MD

## 2020-05-16 NOTE — Assessment & Plan Note (Signed)
Referred to Psychiatry Bupropion a good agent considering her smoking history

## 2020-05-16 NOTE — Assessment & Plan Note (Signed)
BP Readings from Last 1 Encounters:  05/16/20 107/72   Well-controlled with Lisinopril-HCTZ Counseled for compliance with the medications Advised DASH diet and moderate exercise/walking, at least 150 mins/week

## 2020-05-16 NOTE — Assessment & Plan Note (Addendum)
Likely in the setting of orthostatic hypotension vs vertigo Increase fluid intake Advised to avoid sudden positional changes Meclizine PRN Check CMP and magnesium

## 2020-05-17 ENCOUNTER — Telehealth: Payer: Self-pay | Admitting: *Deleted

## 2020-05-17 ENCOUNTER — Encounter (INDEPENDENT_AMBULATORY_CARE_PROVIDER_SITE_OTHER): Payer: Self-pay | Admitting: *Deleted

## 2020-05-17 DIAGNOSIS — R42 Dizziness and giddiness: Secondary | ICD-10-CM

## 2020-05-17 LAB — CBC WITH DIFFERENTIAL/PLATELET
Basophils Absolute: 0.1 10*3/uL (ref 0.0–0.2)
Basos: 1 %
EOS (ABSOLUTE): 0.2 10*3/uL (ref 0.0–0.4)
Eos: 2 %
Hematocrit: 43.3 % (ref 34.0–46.6)
Hemoglobin: 14.4 g/dL (ref 11.1–15.9)
Immature Grans (Abs): 0 10*3/uL (ref 0.0–0.1)
Immature Granulocytes: 0 %
Lymphocytes Absolute: 2.7 10*3/uL (ref 0.7–3.1)
Lymphs: 24 %
MCH: 28.2 pg (ref 26.6–33.0)
MCHC: 33.3 g/dL (ref 31.5–35.7)
MCV: 85 fL (ref 79–97)
Monocytes Absolute: 0.7 10*3/uL (ref 0.1–0.9)
Monocytes: 6 %
Neutrophils Absolute: 7.6 10*3/uL — ABNORMAL HIGH (ref 1.4–7.0)
Neutrophils: 67 %
Platelets: 268 10*3/uL (ref 150–450)
RBC: 5.11 x10E6/uL (ref 3.77–5.28)
RDW: 12.3 % (ref 11.7–15.4)
WBC: 11.4 10*3/uL — ABNORMAL HIGH (ref 3.4–10.8)

## 2020-05-17 LAB — LIPID PANEL
Chol/HDL Ratio: 3.2 ratio (ref 0.0–4.4)
Cholesterol, Total: 158 mg/dL (ref 100–199)
HDL: 50 mg/dL (ref >39)
LDL Chol Calc (NIH): 93 mg/dL (ref 0–99)
Triglycerides: 76 mg/dL (ref 0–149)
VLDL Cholesterol Cal: 15 mg/dL (ref 5–40)

## 2020-05-17 LAB — CMP14+EGFR
ALT: 14 IU/L (ref 0–32)
AST: 12 IU/L (ref 0–40)
Albumin/Globulin Ratio: 1.4 (ref 1.2–2.2)
Albumin: 4.2 g/dL (ref 3.8–4.9)
Alkaline Phosphatase: 75 IU/L (ref 44–121)
BUN/Creatinine Ratio: 20 (ref 9–23)
BUN: 19 mg/dL (ref 6–24)
Bilirubin Total: 0.3 mg/dL (ref 0.0–1.2)
CO2: 26 mmol/L (ref 20–29)
Calcium: 9 mg/dL (ref 8.7–10.2)
Chloride: 103 mmol/L (ref 96–106)
Creatinine, Ser: 0.95 mg/dL (ref 0.57–1.00)
GFR calc Af Amer: 80 mL/min/{1.73_m2} (ref >59)
GFR calc non Af Amer: 70 mL/min/{1.73_m2} (ref >59)
Globulin, Total: 2.9 g/dL (ref 1.5–4.5)
Glucose: 106 mg/dL — ABNORMAL HIGH (ref 65–99)
Potassium: 4 mmol/L (ref 3.5–5.2)
Sodium: 141 mmol/L (ref 134–144)
Total Protein: 7.1 g/dL (ref 6.0–8.5)

## 2020-05-17 LAB — VITAMIN D 25 HYDROXY (VIT D DEFICIENCY, FRACTURES): Vit D, 25-Hydroxy: 21.6 ng/mL — ABNORMAL LOW (ref 30.0–100.0)

## 2020-05-17 LAB — MAGNESIUM: Magnesium: 2.1 mg/dL (ref 1.6–2.3)

## 2020-05-17 MED ORDER — MECLIZINE HCL 25 MG PO TABS: 25.0000 mg | ORAL_TABLET | Freq: Two times a day (BID) | ORAL | 0 refills | Status: DC | PRN

## 2020-05-17 MED ORDER — VITAMIN D (ERGOCALCIFEROL) 1.25 MG (50000 UNIT) PO CAPS: 50000.0000 [IU] | ORAL_CAPSULE | ORAL | 1 refills | Status: DC

## 2020-05-17 NOTE — Addendum Note (Signed)
Addended byTrena Platt on: 05/17/2020 08:21 AM   Modules accepted: Orders

## 2020-05-17 NOTE — Telephone Encounter (Signed)
Pt called said that you told her to take mclezine would be ordered but the pharmacy still does not have this medication would you like to send this medication in?

## 2020-05-17 NOTE — Telephone Encounter (Signed)
Sent to [facility] 

## 2020-05-17 NOTE — Telephone Encounter (Signed)
Pt notified with verbal understanding  °

## 2020-06-09 ENCOUNTER — Telehealth (INDEPENDENT_AMBULATORY_CARE_PROVIDER_SITE_OTHER): Payer: BC Managed Care – PPO | Admitting: Internal Medicine

## 2020-06-09 ENCOUNTER — Encounter: Payer: Self-pay | Admitting: Internal Medicine

## 2020-06-09 VITALS — BP 156/88 | Ht 68.0 in | Wt 186.0 lb

## 2020-06-09 DIAGNOSIS — R1013 Epigastric pain: Secondary | ICD-10-CM | POA: Diagnosis not present

## 2020-06-09 DIAGNOSIS — R42 Dizziness and giddiness: Secondary | ICD-10-CM

## 2020-06-09 DIAGNOSIS — R112 Nausea with vomiting, unspecified: Secondary | ICD-10-CM

## 2020-06-09 NOTE — Patient Instructions (Signed)
Please avoid taking Zofran for longer periods as it has tendency to disrupt heart rhythm.  Okay to take Meclizine up to twice daily for nausea and/or dizziness.  Please try to stay hydrated by taking at least 2 l of fluid in a day.  Please refrain from smoking or any illicit substance use as it can cause cyclical nausea and/or vomiting.

## 2020-06-09 NOTE — Progress Notes (Signed)
Virtual Visit via Telephone Note   This visit type was conducted due to national recommendations for restrictions regarding the COVID-19 Pandemic (e.g. social distancing) in an effort to limit this patient's exposure and mitigate transmission in our community.  Due to her co-morbid illnesses, this patient is at least at moderate risk for complications without adequate follow up.  This format is felt to be most appropriate for this patient at this time.  The patient did not have access to video technology/had technical difficulties with video requiring transitioning to audio format only (telephone).  All issues noted in this document were discussed and addressed.  No physical exam could be performed with this format.  Evaluation Performed:  Follow-up visit  Date:  06/09/2020   ID:  Lindsay Mcmillan, DOB 06/17/1971, MRN [id number]  Patient Location: Home Provider Location: Office/Clinic  Participants: Patient Location of Patient: Home Location of Provider: Telehealth Consent was obtain for visit to be over via telehealth. I verified that I am speaking with the correct person using two identifiers.  PCP:  Freddy Finner, NP   Chief Complaint:  Nausea and vomiting  History of Present Illness:    Lindsay Mcmillan is a 49 y.o. female with past medical history of hypertension, COPD, depression and tobacco abuse who has a televisit for c/o nausea and vomiting.  Patient c/o nausea, which has been happening every evening for last few weeks. She had an episode of nonbloody, nonbilious vomiting yesterday just before she was getting ready to go to work. She had episode of severe sweating before the vomiting. She has been taking Zofran for many days for nausea. She also c/o dizziness at times, for which she takes Meclizine PRN. She denies any unusual food intake or raw meat food. She denies any recent sick contacts. Of note, she also mentions heartburn sensation that has been chronic, but has not  tried any medications yet. She c/o epigastric pain associated with nausea.  The patient does not have symptoms concerning for COVID-19 infection (fever, chills, cough, or new shortness of breath).   Past Medical, Surgical, Social History, Allergies, and Medications have been Reviewed.  Past Medical History:  Diagnosis Date  . BOILS, RECURRENT 10/10/2009   Qualifier: Diagnosis of  By: De Blanch.   . Chest pain in adult 09/13/2019  . COPD (chronic obstructive pulmonary disease) (HCC)   . Encounter for screening mammogram for malignant neoplasm of breast 08/11/2019  . Tobacco dependence    Past Surgical History:  Procedure Laterality Date  . ABDOMINAL HYSTERECTOMY    . CESAREAN SECTION       Current Meds  Medication Sig  . acetaminophen (TYLENOL) 500 MG tablet Take 1,000 mg by mouth every 6 (six) hours as needed for mild pain or headache.  . albuterol (VENTOLIN HFA) 108 (90 Base) MCG/ACT inhaler Inhale 2 puffs into the lungs every 6 (six) hours as needed for wheezing or shortness of breath.  . buprenorphine-naloxone (SUBOXONE) 8-2 mg SUBL SL tablet Place 1 tablet under the tongue daily.  Marland Kitchen ibuprofen (ADVIL,MOTRIN) 200 MG tablet Take 800 mg by mouth every 6 (six) hours as needed for headache or moderate pain.  Marland Kitchen lisinopril-hydrochlorothiazide (ZESTORETIC) 10-12.5 MG tablet Take 1 tablet by mouth daily.  . meclizine (ANTIVERT) 25 MG tablet Take 1 tablet (25 mg total) by mouth 2 (two) times daily as needed for dizziness.  . ondansetron (ZOFRAN-ODT) 4 MG disintegrating tablet Take by mouth.  . Vitamin D, Ergocalciferol, (DRISDOL)  1.25 MG (50000 UNIT) CAPS capsule Take 1 capsule (50,000 Units total) by mouth every 7 (seven) days.     Allergies:   Patient has no known allergies.   ROS:   Please see the history of present illness.     All other systems reviewed and are negative.   Labs/Other Tests and Data Reviewed:    Recent Labs: 05/16/2020: ALT 14; BUN 19; Creatinine, Ser  0.95; Hemoglobin 14.4; Magnesium 2.1; Platelets 268; Potassium 4.0; Sodium 141   Recent Lipid Panel Lab Results  Component Value Date/Time   CHOL 158 05/16/2020 02:40 PM   TRIG 76 05/16/2020 02:40 PM   HDL 50 05/16/2020 02:40 PM   CHOLHDL 3.2 05/16/2020 02:40 PM   LDLCALC 93 05/16/2020 02:40 PM    Wt Readings from Last 3 Encounters:  06/09/20 186 lb (84.4 kg)  05/16/20 182 lb 1.9 oz (82.6 kg)  04/20/20 185 lb (83.9 kg)     Objective:    Vital Signs:  BP (!) 156/88   Ht 5\' 8"  (1.727 m)   Wt 186 lb (84.4 kg)   BMI 28.28 kg/m     ASSESSMENT & PLAN:    Nausea/vomiting Dizziness Epigastric pain Unclear etiology Vertigo vs substance abuse vs panic episodes Could be related to PUD, will check H Pylori testing If persistent, would refer to ENT specialist for evaluation of BPPV Avoid Zofran for long term due to its QT prolonging effects Meclizine PRN   Time:   Today, I have spent 15 minutes reviewing the chart, including problem list, medications, and with the patient with telehealth technology discussing the above problems.   Medication Adjustments/Labs and Tests Ordered: Current medicines are reviewed at length with the patient today.  Concerns regarding medicines are outlined above.   Tests Ordered: Orders Placed This Encounter  Procedures  . H. pylori breath test    Medication Changes: No orders of the defined types were placed in this encounter.    Note: This dictation was prepared with Dragon dictation along with smaller phrase technology. Similar sounding words can be transcribed inadequately or may not be corrected upon review. Any transcriptional errors that result from this process are unintentional.      Disposition:  Follow up  Signed, , MD  06/09/2020 9:01 AM     14/08/2019 [facility]

## 2020-06-15 ENCOUNTER — Ambulatory Visit (INDEPENDENT_AMBULATORY_CARE_PROVIDER_SITE_OTHER): Payer: BC Managed Care – PPO | Admitting: Family Medicine

## 2020-06-15 ENCOUNTER — Encounter: Payer: Self-pay | Admitting: Family Medicine

## 2020-06-15 ENCOUNTER — Other Ambulatory Visit: Payer: Self-pay

## 2020-06-15 VITALS — BP 127/83 | HR 71 | Temp 98.2°F | Ht 68.0 in | Wt 185.0 lb

## 2020-06-15 DIAGNOSIS — Z720 Tobacco use: Secondary | ICD-10-CM

## 2020-06-15 DIAGNOSIS — G479 Sleep disorder, unspecified: Secondary | ICD-10-CM | POA: Diagnosis not present

## 2020-06-15 DIAGNOSIS — J449 Chronic obstructive pulmonary disease, unspecified: Secondary | ICD-10-CM

## 2020-06-15 DIAGNOSIS — R42 Dizziness and giddiness: Secondary | ICD-10-CM

## 2020-06-15 DIAGNOSIS — R0683 Snoring: Secondary | ICD-10-CM

## 2020-06-15 MED ORDER — TRELEGY ELLIPTA 100-62.5-25 MCG/INH IN AEPB: 1.0000 | INHALATION_SPRAY | Freq: Every day | RESPIRATORY_TRACT | 1 refills | Status: DC

## 2020-06-15 NOTE — Assessment & Plan Note (Signed)
Asked about quitting: confirms they are currently smokes cigarettes Advise to quit smoking: Educated about QUITTING to reduce the risk of cancer, cardio and cerebrovascular disease. Assess willingness: Unwilling to quit at this time, but is working on cutting back. Assist with counseling and pharmacotherapy: Counseled for 5 minutes and literature provided. Arrange for follow up: working on quitting follow up in 3 months and continue to offer help.   She reports that she is down to around 1/2 pack daily now from 2 packs back in Oct.

## 2020-06-15 NOTE — Assessment & Plan Note (Signed)
New referral due to previously referral going to office that does not assess sleep.

## 2020-06-15 NOTE — Patient Instructions (Addendum)
  I appreciate the opportunity to provide you with care for your health and wellness. Today we discussed: several concerns   Follow up: 8 weeks for dizziness and COPD  No labs  Referrals today: lung dr, PT, and the neurologist for sleep study needs (please check voicemails and keep appts)  I have sent in an inhaler for you to start using once daily. Please review instructions on use.  Please continue to use your rescue inhaler for shortness of breath or cough that is not controlled.   Please continue to practice social distancing to keep you, your family, and our community safe.  If you must go out, please wear a mask and practice good handwashing.  It was a pleasure to see you and I look forward to continuing to work together on your health and well-being. Please do not hesitate to call the office if you need care or have questions about your care.  Have a wonderful day. With Gratitude, Tereasa Coop, DNP, AGNP-BC

## 2020-06-15 NOTE — Assessment & Plan Note (Signed)
Snoring- questionable apnea. New referral to office that does sleep studies.

## 2020-06-15 NOTE — Assessment & Plan Note (Signed)
No orthostatics noted in office today. Vertigo vs pinched nerve in neck? PT for assessment If that fails then neurology vs ENT for neck/sinus pathology Encouraged slow position changes, fluids, and use of meclizine for now.   Reviewed side effects, risks and benefits of medication.   Patient acknowledged agreement and understanding of the plan.
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
## 2020-06-15 NOTE — Assessment & Plan Note (Signed)
She reports worsening of COPD S&S  She no showed her pulm appt in June- never called back to r/s. I have placed the referral again and talk to her about keeping the appt and returning the calls. I will start a daily inhaler as she is having S&S today in office. Educated on use and again encouraged to follow up with the lung specialist.   Reviewed side effects, risks and benefits of medication.   Patient acknowledged agreement and understanding of the plan.

## 2020-06-17 LAB — H. PYLORI BREATH TEST: H pylori Breath Test: NEGATIVE

## 2020-06-17 NOTE — Progress Notes (Signed)
Pt advised of lab results with verbal understanding  

## 2020-06-23 ENCOUNTER — Encounter (INDEPENDENT_AMBULATORY_CARE_PROVIDER_SITE_OTHER): Payer: Self-pay | Admitting: *Deleted

## 2020-06-24 ENCOUNTER — Inpatient Hospital Stay: Admission: RE | Admit: 2020-06-24 | Payer: BC Managed Care – PPO | Source: Ambulatory Visit

## 2020-06-27 ENCOUNTER — Ambulatory Visit (HOSPITAL_COMMUNITY): Payer: BC Managed Care – PPO | Admitting: Physical Therapy

## 2020-06-28 ENCOUNTER — Ambulatory Visit: Payer: BC Managed Care – PPO | Admitting: Family Medicine

## 2020-07-16 ENCOUNTER — Other Ambulatory Visit: Payer: Self-pay | Admitting: Family Medicine

## 2020-07-16 DIAGNOSIS — I10 Essential (primary) hypertension: Secondary | ICD-10-CM

## 2020-07-28 ENCOUNTER — Other Ambulatory Visit: Payer: BC Managed Care – PPO

## 2020-07-28 ENCOUNTER — Other Ambulatory Visit: Payer: Self-pay

## 2020-07-28 DIAGNOSIS — Z20822 Contact with and (suspected) exposure to covid-19: Secondary | ICD-10-CM

## 2020-07-30 LAB — SARS-COV-2, NAA 2 DAY TAT

## 2020-07-30 LAB — NOVEL CORONAVIRUS, NAA: SARS-CoV-2, NAA: NOT DETECTED

## 2020-07-31 ENCOUNTER — Encounter: Payer: Self-pay | Admitting: Family Medicine

## 2020-08-01 ENCOUNTER — Other Ambulatory Visit: Payer: Self-pay | Admitting: Internal Medicine

## 2020-08-01 DIAGNOSIS — J069 Acute upper respiratory infection, unspecified: Secondary | ICD-10-CM

## 2020-08-01 MED ORDER — AZITHROMYCIN 250 MG PO TABS: ORAL_TABLET | ORAL | 0 refills | Status: DC

## 2020-08-05 ENCOUNTER — Ambulatory Visit: Payer: BC Managed Care – PPO

## 2020-08-09 ENCOUNTER — Ambulatory Visit: Payer: BC Managed Care – PPO | Admitting: Nurse Practitioner

## 2020-08-10 ENCOUNTER — Ambulatory Visit: Payer: BC Managed Care – PPO | Admitting: Family Medicine

## 2020-08-16 ENCOUNTER — Ambulatory Visit: Payer: BC Managed Care – PPO | Admitting: Nurse Practitioner

## 2020-09-01 ENCOUNTER — Ambulatory Visit (INDEPENDENT_AMBULATORY_CARE_PROVIDER_SITE_OTHER): Payer: BC Managed Care – PPO | Admitting: Gastroenterology

## 2020-10-03 ENCOUNTER — Ambulatory Visit (INDEPENDENT_AMBULATORY_CARE_PROVIDER_SITE_OTHER): Payer: BC Managed Care – PPO | Admitting: Gastroenterology

## 2020-10-03 ENCOUNTER — Telehealth (INDEPENDENT_AMBULATORY_CARE_PROVIDER_SITE_OTHER): Payer: Self-pay

## 2020-10-03 ENCOUNTER — Encounter (INDEPENDENT_AMBULATORY_CARE_PROVIDER_SITE_OTHER): Payer: Self-pay | Admitting: Gastroenterology

## 2020-10-03 NOTE — Telephone Encounter (Signed)
Patient no showed for appointment with Dr. Daniel Castaneda on 10/03/2020 at [facility]. 

## 2020-11-07 ENCOUNTER — Other Ambulatory Visit: Payer: Self-pay | Admitting: Family Medicine

## 2020-11-07 DIAGNOSIS — I10 Essential (primary) hypertension: Secondary | ICD-10-CM

## 2020-11-25 ENCOUNTER — Other Ambulatory Visit: Payer: Self-pay | Admitting: Family Medicine

## 2020-11-25 DIAGNOSIS — J449 Chronic obstructive pulmonary disease, unspecified: Secondary | ICD-10-CM

## 2020-12-09 ENCOUNTER — Ambulatory Visit: Payer: BC Managed Care – PPO | Admitting: Internal Medicine

## 2020-12-09 ENCOUNTER — Encounter: Payer: Self-pay | Admitting: Internal Medicine

## 2020-12-09 ENCOUNTER — Other Ambulatory Visit: Payer: Self-pay

## 2020-12-09 VITALS — BP 153/86 | HR 71 | Temp 98.5°F | Resp 18 | Ht 68.0 in | Wt 179.4 lb

## 2020-12-09 DIAGNOSIS — I10 Essential (primary) hypertension: Secondary | ICD-10-CM | POA: Diagnosis not present

## 2020-12-09 DIAGNOSIS — Z124 Encounter for screening for malignant neoplasm of cervix: Secondary | ICD-10-CM

## 2020-12-09 DIAGNOSIS — R1031 Right lower quadrant pain: Secondary | ICD-10-CM | POA: Diagnosis not present

## 2020-12-09 DIAGNOSIS — R42 Dizziness and giddiness: Secondary | ICD-10-CM

## 2020-12-09 HISTORY — DX: Right lower quadrant pain: R10.31

## 2020-12-09 MED ORDER — MECLIZINE HCL 25 MG PO TABS: 25.0000 mg | ORAL_TABLET | Freq: Two times a day (BID) | ORAL | 0 refills | Status: DC | PRN

## 2020-12-09 NOTE — Assessment & Plan Note (Signed)
Has h/o vertigo Could be reason for dizziness and nausea Meclizine PRN

## 2020-12-09 NOTE — Progress Notes (Signed)
Acute Office Visit  Subjective:    Patient ID: Lindsay Mcmillan, female    DOB: [birth year], 50 y.o.   MRN: [id number]  Chief Complaint  Patient presents with  . Flank Pain    Pt having right side pain on the lower right side when she is active when she wears a belt it feels like its pulling she has been getting headaches feeling light headed and dizzy for the last 4 to 5 days    HPI Patient is in today for evaluation of right inguinal area pain/pulling sensation for last 4-5 days. She denies any recent injury. Denies noticing any bulge in the area. She has h/o abdominal surgeries (C-section and hysterectomy). 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 Denies any nausea, vomiting, constipation, diarrhea, melena or hematochezia. Denies dysuria or hematuria.  She also has been having intermittent dizziness, headache and nausea. She has been taking her BP medications regularly. She has h/o vertigo, for which she used to take Meclizine.  Past Medical History:  Diagnosis Date  . BOILS, RECURRENT 10/10/2009   Qualifier: Diagnosis of  By: De Blanch.   . Chest pain in adult 09/13/2019  . COPD (chronic obstructive pulmonary disease) (HCC)   . Encounter for screening mammogram for malignant neoplasm of breast 08/11/2019  . Tobacco dependence     Past Surgical History:  Procedure Laterality Date  . ABDOMINAL HYSTERECTOMY    . CESAREAN SECTION      Family History  Problem Relation Age of Onset  . Diabetes Other   . Cancer Other     Social History   Socioeconomic History  . Marital status: Single    Spouse name: Not on file  . Number of children: Not on file  . Years of education: Not on file  . Highest education level: Not on file  Occupational History  . Not on file  Tobacco Use  . Smoking status: Current Every Day Smoker    Packs/day: 2.00    Years: 30.00    Pack years: 60.00    Types: Cigarettes  . Smokeless tobacco: Never Used   Vaping Use  . Vaping Use: Former  Substance and Sexual Activity  . Alcohol use: No  . Drug use: No  . Sexual activity: Yes    Birth control/protection: None  Other Topics Concern  . 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 Difficulty of Paying Living Expenses: Not hard at all  Food Insecurity: No Food Insecurity  . Worried About Programme researcher, broadcasting/film/video in the Last Year: Never true  . Ran Out of Food in the Last Year: Never true  Transportation Needs: No Transportation Needs  . Lack of Transportation (Medical): No  . Lack of Transportation (Non-Medical): No  Physical Activity: Sufficiently Active  . Days of Exercise per Week: 4 days  . Minutes of Exercise per Session: 60 min  Stress: Stress Concern Present  . Feeling of  Stress : Very much  Social Connections: Moderately Integrated  . Frequency of Communication with Friends and Family: More than three times a week  . Frequency of Social Gatherings with Friends and Family: Once a week  . Attends Religious Services: More than 4 times per year  . Active Member of Clubs or Organizations: No  . Attends BankerClub or Organization Meetings: Never  . Marital Status: Living with partner  Intimate Partner Violence: Not At Risk  . Fear of Current or Ex-Partner: No  . Emotionally Abused: No  . Physically Abused: No  . Sexually Abused: No    Outpatient Medications Prior to Visit  Medication Sig Dispense Refill  . acetaminophen (TYLENOL) 500 MG tablet Take 1,000 mg by mouth every 6 (six) hours as needed for mild pain or headache.    . buprenorphine-naloxone (SUBOXONE) 8-2 mg SUBL SL tablet Place 1 tablet under  the tongue daily.    Marland Kitchen. ibuprofen (ADVIL,MOTRIN) 200 MG tablet Take 800 mg by mouth every 6 (six) hours as needed for headache or moderate pain.    Marland Kitchen. lisinopril-hydrochlorothiazide (ZESTORETIC) 10-12.5 MG tablet TAKE 1 TABLET BY MOUTH EVERY DAY 90 tablet 0  . TRELEGY ELLIPTA 100-62.5-25 MCG/INH AEPB INHALE 1 DOSE INTO THE LUNGS DAILY BEFORE BREAKFAST. 60 each 1  . meclizine (ANTIVERT) 25 MG tablet Take 1 tablet (25 mg total) by mouth 2 (two) times daily as needed for dizziness. 30 tablet 0  . ondansetron (ZOFRAN-ODT) 4 MG disintegrating tablet Take by mouth. (Patient not taking: Reported on 12/09/2020)    . albuterol (VENTOLIN HFA) 108 (90 Base) MCG/ACT inhaler Inhale 2 puffs into the lungs every 6 (six) hours as needed for wheezing or shortness of breath. (Patient not taking: Reported on 12/09/2020) 18 g 1  . azithromycin (ZITHROMAX) 250 MG tablet Take as package instructions. (Patient not taking: Reported on 12/09/2020) 6 tablet 0  . Vitamin D, Ergocalciferol, (DRISDOL) 1.25 MG (50000 UNIT) CAPS capsule Take 1 capsule (50,000 Units total) by mouth every 7 (seven) days. (Patient not taking: Reported on 12/09/2020) 12 capsule 1   No facility-administered medications prior to visit.    No Known Allergies  Review of Systems  Constitutional: Negative for chills and fever.  HENT: Negative for congestion, sinus pressure, sinus pain and sore throat.   Eyes: Negative for pain and discharge.  Respiratory: Negative for cough and shortness of breath.   Cardiovascular: Negative for chest pain and palpitations.  Gastrointestinal: Negative for constipation, diarrhea, nausea and vomiting.       R inguinal straining  Endocrine: Negative for polydipsia and polyuria.  Genitourinary: Negative for dysuria and hematuria.  Musculoskeletal: Negative for neck pain and neck stiffness.  Skin: Negative for rash.  Neurological: Positive for dizziness and headaches. Negative for weakness.  Psychiatric/Behavioral: Negative  for agitation and behavioral problems.       Objective:    Physical Exam Vitals reviewed.  Constitutional:      General: She is not in acute distress.    Appearance: She is not diaphoretic.  HENT:     Head: Normocephalic and atraumatic.     Nose: Nose normal.     Mouth/Throat:     Mouth: Mucous membranes are moist.  Eyes:     General: No scleral icterus.    Extraocular Movements: Extraocular movements intact.  Cardiovascular:     Rate and Rhythm: Normal rate and regular rhythm.     Pulses: Normal pulses.     Heart sounds: Normal heart sounds. No murmur  heard.   Pulmonary:     Breath sounds: Normal breath sounds. No wheezing or rales.  Abdominal:     Palpations: Abdomen is soft. There is no mass.     Tenderness: There is no abdominal tenderness. There is no guarding or rebound.  Musculoskeletal:     Cervical back: Neck supple. No tenderness.     Right lower leg: No edema.     Left lower leg: No edema.  Skin:    General: Skin is warm.     Findings: No rash.  Neurological:     General: No focal deficit present.     Mental Status: She is alert and oriented to person, place, and time.  Psychiatric:        Mood and Affect: Mood normal.        Behavior: Behavior normal.     BP (!) 153/86 (BP Location: Left Arm, Patient Position: Sitting, Cuff Size: Normal)   Pulse 71   Temp 98.5 F (36.9 C) (Oral)   Resp 18   Ht 5\' 8"  (1.727 m)   Wt 179 lb 6.4 oz (81.4 kg)   SpO2 94%   BMI 27.28 kg/m  Wt Readings from Last 3 Encounters:  12/09/20 179 lb 6.4 oz (81.4 kg)  06/15/20 185 lb (83.9 kg)  06/09/20 186 lb (84.4 kg)    Health Maintenance Due  Topic Date Due  . Pneumococcal Vaccine 33-67 Years old (1 of 2 - PPSV23) Never done  . HIV Screening  Never done  . Hepatitis C Screening  Never done  . TETANUS/TDAP  Never done  . PAP SMEAR-Modifier  Never done  . COLONOSCOPY (Pts 45-60yrs Insurance coverage will need to be confirmed)  Never done  . MAMMOGRAM  Never done  .  Zoster Vaccines- Shingrix (1 of 2) Never done  . COVID-19 Vaccine (3 - Booster for Moderna series) 06/25/2020    There are no preventive care reminders to display for this patient.   No results found for: TSH Lab Results  Component Value Date   WBC 11.4 (H) 05/16/2020   HGB 14.4 05/16/2020   HCT 43.3 05/16/2020   MCV 85 05/16/2020   PLT 268 05/16/2020   Lab Results  Component Value Date   NA 141 05/16/2020   K 4.0 05/16/2020   CO2 26 05/16/2020   GLUCOSE 106 (H) 05/16/2020   BUN 19 05/16/2020   CREATININE 0.95 05/16/2020   BILITOT 0.3 05/16/2020   ALKPHOS 75 05/16/2020   AST 12 05/16/2020   ALT 14 05/16/2020   PROT 7.1 05/16/2020   ALBUMIN 4.2 05/16/2020   CALCIUM 9.0 05/16/2020   ANIONGAP 10 03/05/2014   Lab Results  Component Value Date   CHOL 158 05/16/2020   Lab Results  Component Value Date   HDL 50 05/16/2020   Lab Results  Component Value Date   LDLCALC 93 05/16/2020   Lab Results  Component Value Date   TRIG 76 05/16/2020   Lab Results  Component Value Date   CHOLHDL 3.2 05/16/2020   No results found for: HGBA1C     Assessment & Plan:   Problem List Items Addressed This Visit      Inguinal pain, right - Primary   No visible mass/bulge But history of abdominal surgeries and current presentation concerning for hernia Will check CT abdomen/pelvis     Relevant Orders  CT Abdomen Pelvis Wo Contrast    Cardiovascular and Mediastinum   Essential hypertension    Elevated today  Has had well-controlled BP in the past Could be due to abdominal discomfort and a component of stress If persistently elevated, will adjust dose of Lisinopril-HCTZ        Other   Dizziness    Has h/o vertigo Could be reason for dizziness and nausea Meclizine PRN      Relevant Medications   meclizine (ANTIVERT) 25 MG tablet    Other Visit Diagnoses    Routine cervical smear       Relevant Orders   Ambulatory referral to Obstetrics / Gynecology        Meds ordered this encounter  Medications  . meclizine (ANTIVERT) 25 MG tablet    Sig: Take 1 tablet (25 mg total) by mouth 2 (two) times daily as needed for dizziness.    Dispense:  30 tablet    Refill:  0     Kaelea Gathright Concha Se, MD

## 2020-12-09 NOTE — Assessment & Plan Note (Signed)
No visible mass/bulge But history of abdominal surgeries and current presentation concerning for hernia Will check CT abdomen/pelvis

## 2020-12-09 NOTE — Assessment & Plan Note (Signed)
Elevated today Has had well-controlled BP in the past Could be due to abdominal discomfort and a component of stress If persistently elevated, will adjust dose of Lisinopril-HCTZ

## 2020-12-09 NOTE — Patient Instructions (Signed)
Please avoid any heavy lifting more than 10 lbs until investigation is undergoing.  Please take at least 64 ounces of fluid in a day. Okay to take Meclizine for dizziness and nausea.  If you have severe abdominal pain or change in pain quality, please go to ER immediately.

## 2020-12-22 ENCOUNTER — Other Ambulatory Visit: Payer: Self-pay

## 2020-12-22 ENCOUNTER — Ambulatory Visit (HOSPITAL_COMMUNITY)
Admission: RE | Admit: 2020-12-22 | Discharge: 2020-12-22 | Disposition: A | Discharge status: Home / Self Care | Payer: BC Managed Care – PPO | Source: Ambulatory Visit | Attending: Internal Medicine | Admitting: Internal Medicine

## 2020-12-22 DIAGNOSIS — R1031 Right lower quadrant pain: Secondary | ICD-10-CM | POA: Insufficient documentation

## 2020-12-23 ENCOUNTER — Other Ambulatory Visit: Payer: Self-pay | Admitting: Internal Medicine

## 2020-12-23 DIAGNOSIS — R1031 Right lower quadrant pain: Secondary | ICD-10-CM

## 2021-01-12 ENCOUNTER — Encounter: Payer: Self-pay | Admitting: Family Medicine

## 2021-02-02 ENCOUNTER — Ambulatory Visit: Payer: BC Managed Care – PPO | Admitting: General Surgery

## 2021-02-07 ENCOUNTER — Other Ambulatory Visit: Payer: Self-pay | Admitting: Nurse Practitioner

## 2021-02-07 DIAGNOSIS — I10 Essential (primary) hypertension: Secondary | ICD-10-CM

## 2021-05-27 IMAGING — CT CT CHEST W/O CM
2 of 4 series · 15 of 36 positions shown, 18 images · non-contrast
Comparison: None.

CLINICAL DATA: Shortness of breath. Intermittent chest pain. COPD.

EXAM:
CT CHEST WITHOUT CONTRAST
TECHNIQUE: Multidetector CT imaging of the chest was performed following the
standard protocol without IV contrast.

[Series 2: routine chest without · axial · non-contrast · 0.59mm/px · z∈[+1139,+1413]mm · 12 of 163 slices shown, 15 images]
[im 13/163  mediastinal]
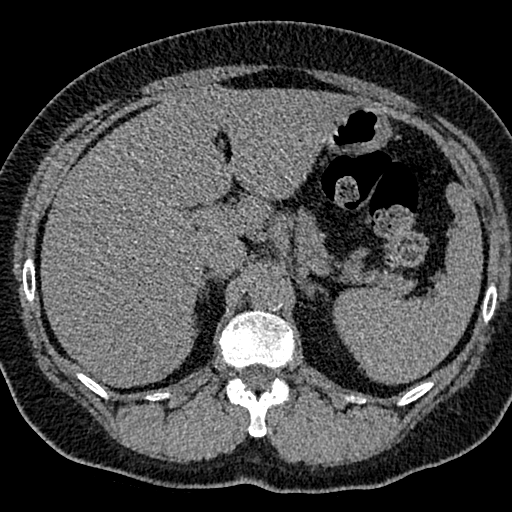
[im 13/163  lung]
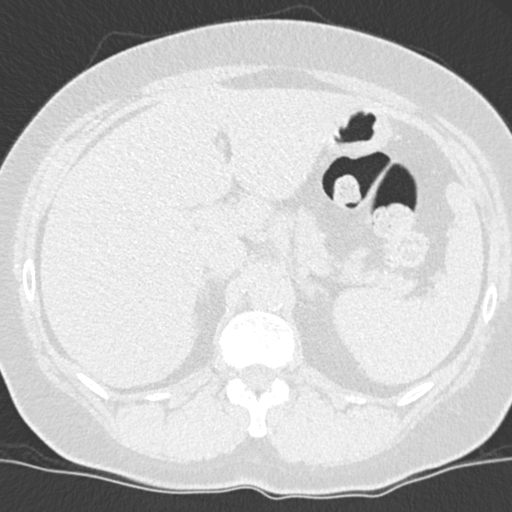
[im 25/163  lung]
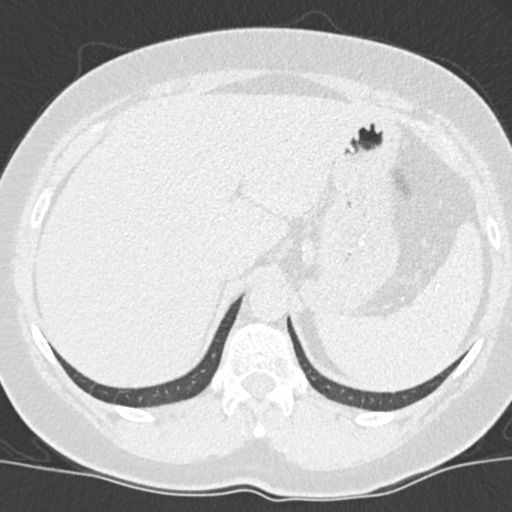
[im 38/163  lung]
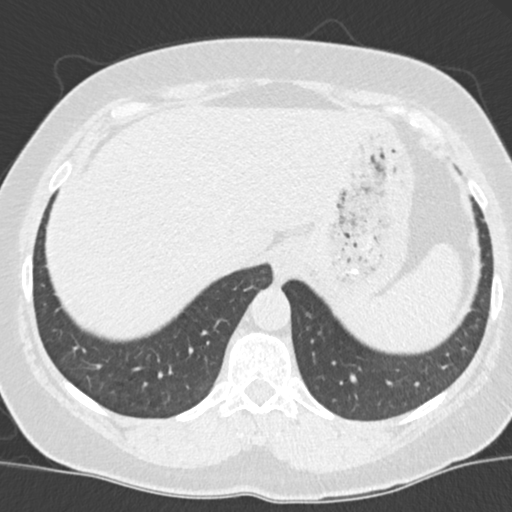
[im 50/163  lung]
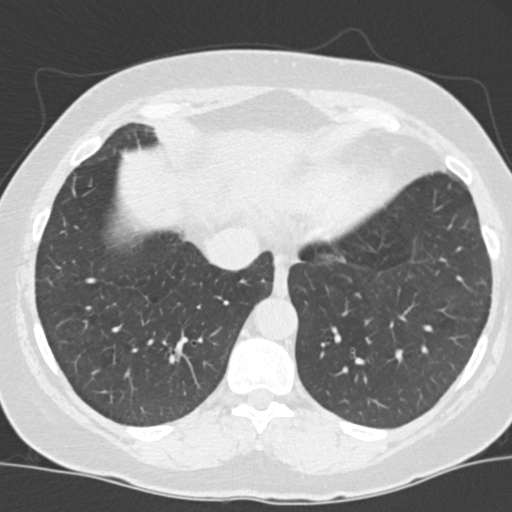
[im 63/163  mediastinal]
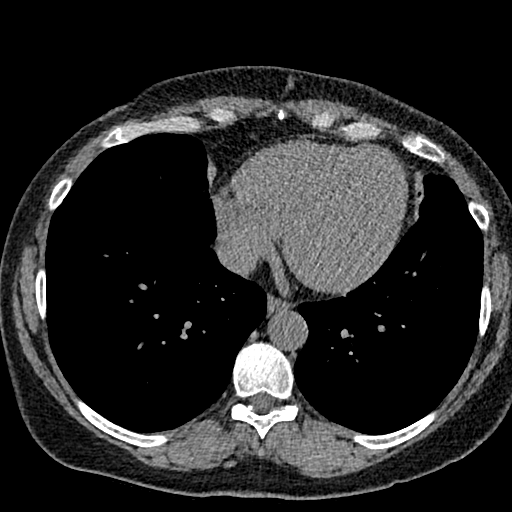
[im 63/163  lung]
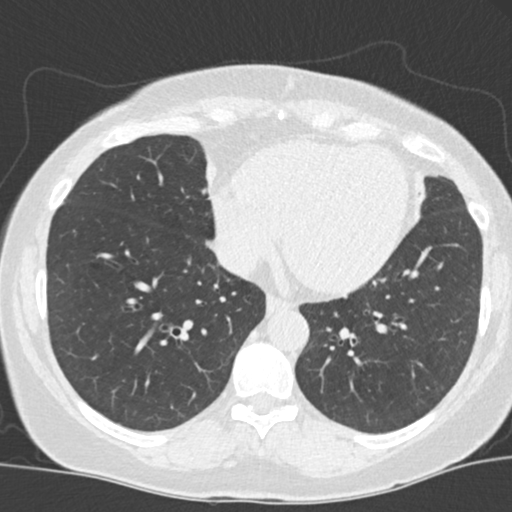
[im 75/163  lung]
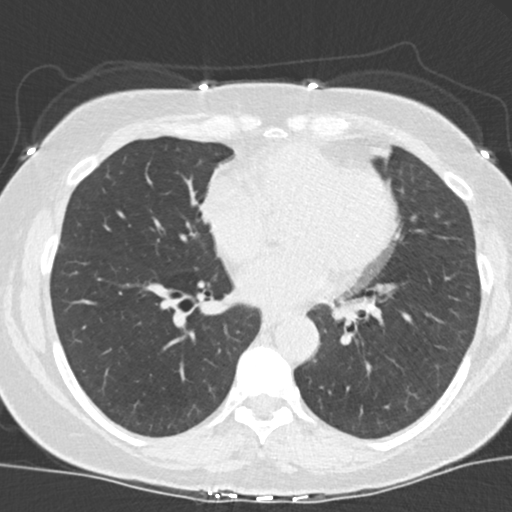
[im 88/163  lung]
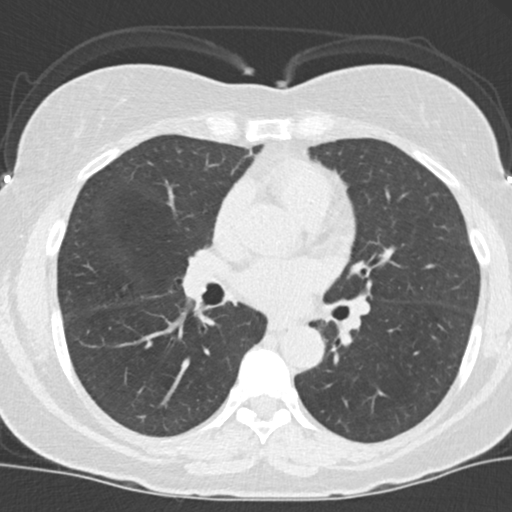
[im 100/163  lung]
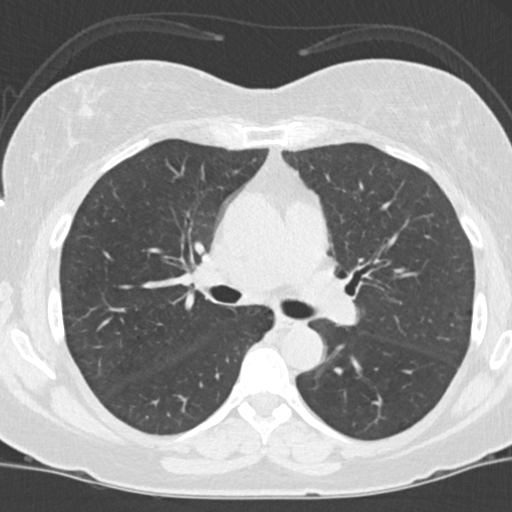
[im 113/163  mediastinal]
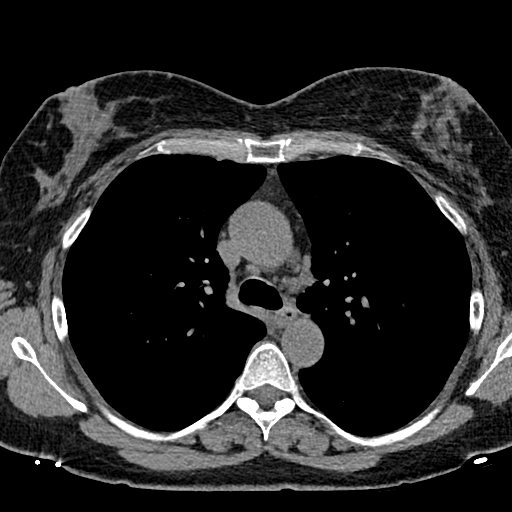
[im 113/163  lung]
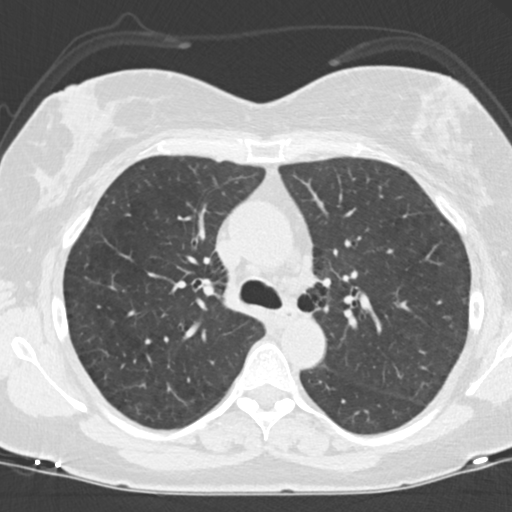
[im 125/163  lung]
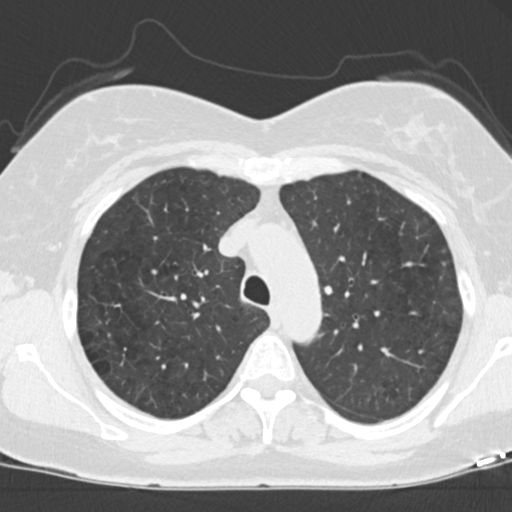
[im 138/163  lung]
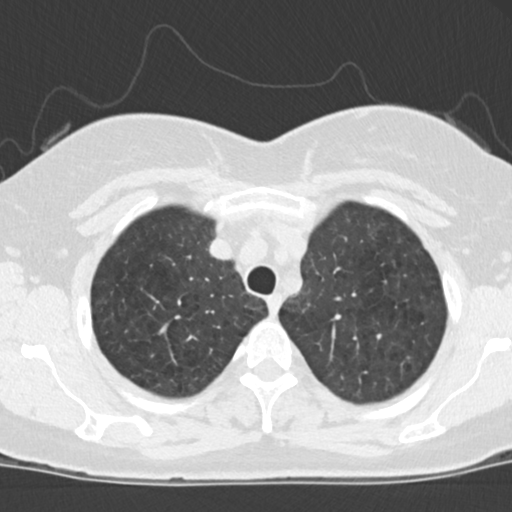
[im 150/163  lung]
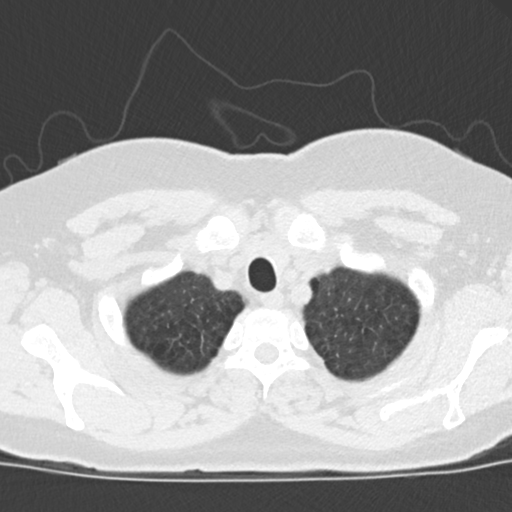

[Series 5: coronal · coronal · 0.64mm/px · 3 of 151 slices shown]
[im 31/151  lung]
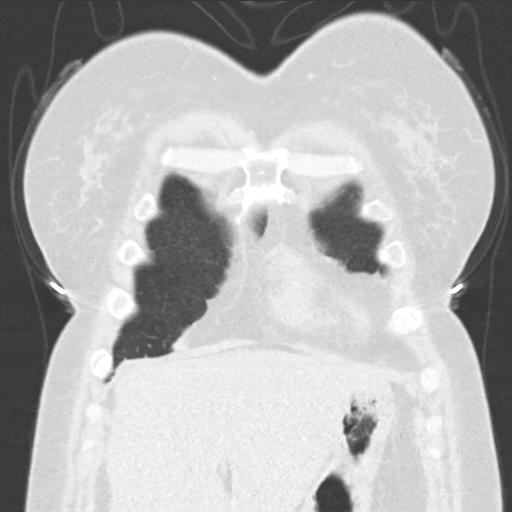
[im 61/151  lung]
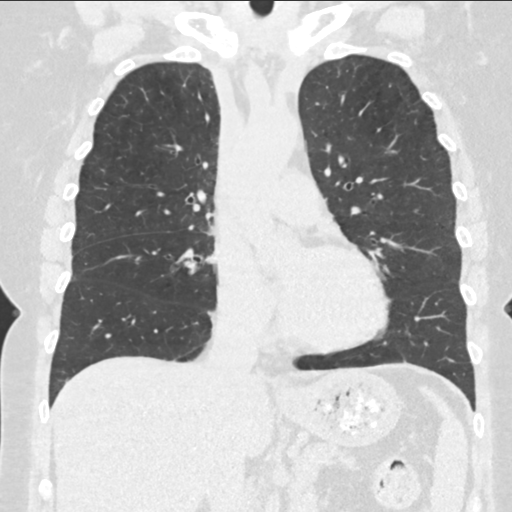
[im 91/151  lung]
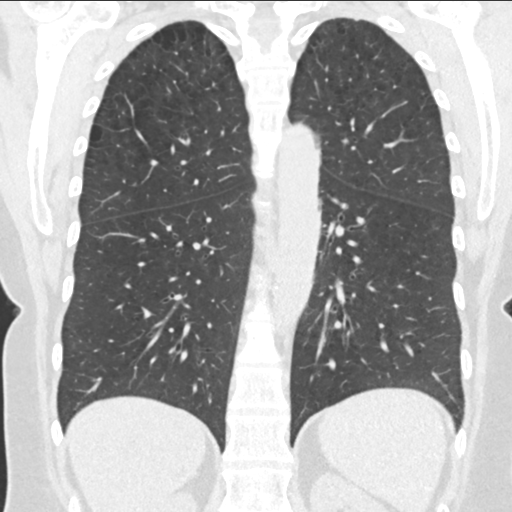

[15 of 36 positions shown; findings below may reference images not displayed]

FINDINGS: Cardiovascular:  No acute findings.

Mediastinum/Nodes: No masses or pathologically enlarged lymph nodes
identified on this unenhanced exam.

Lungs/Pleura: Mild centrilobular emphysema is seen. No evidence
interstitial lung disease or fibrosis. No evidence of pulmonary
infiltrate or pleural effusion. No suspicious pulmonary nodules or
masses identified.

Upper Abdomen:  Unremarkable.

Musculoskeletal:  No suspicious bone lesions.
IMPRESSION: No acute findings or active disease within the thorax.

Mild centrilobular emphysema.

Emphysema ([id number]).

## 2022-03-07 ENCOUNTER — Other Ambulatory Visit: Payer: Self-pay

## 2022-03-07 ENCOUNTER — Encounter (HOSPITAL_COMMUNITY): Payer: Self-pay | Admitting: Emergency Medicine

## 2022-03-07 ENCOUNTER — Emergency Department (HOSPITAL_COMMUNITY)
Admission: EM | Admit: 2022-03-07 | Discharge: 2022-03-07 | Disposition: A | Discharge status: Home / Self Care | Payer: BC Managed Care – PPO | Attending: Student | Admitting: Student

## 2022-03-07 DIAGNOSIS — I10 Essential (primary) hypertension: Secondary | ICD-10-CM | POA: Insufficient documentation

## 2022-03-07 DIAGNOSIS — L03115 Cellulitis of right lower limb: Secondary | ICD-10-CM | POA: Insufficient documentation

## 2022-03-07 DIAGNOSIS — F17210 Nicotine dependence, cigarettes, uncomplicated: Secondary | ICD-10-CM | POA: Insufficient documentation

## 2022-03-07 DIAGNOSIS — J449 Chronic obstructive pulmonary disease, unspecified: Secondary | ICD-10-CM | POA: Insufficient documentation

## 2022-03-07 DIAGNOSIS — T782XXA Anaphylactic shock, unspecified, initial encounter: Secondary | ICD-10-CM

## 2022-03-07 DIAGNOSIS — R7989 Other specified abnormal findings of blood chemistry: Secondary | ICD-10-CM | POA: Insufficient documentation

## 2022-03-07 DIAGNOSIS — L0390 Cellulitis, unspecified: Secondary | ICD-10-CM

## 2022-03-07 LAB — COMPREHENSIVE METABOLIC PANEL
ALT: 28 U/L (ref 0–44)
AST: 17 U/L (ref 15–41)
Albumin: 3.4 g/dL — ABNORMAL LOW (ref 3.5–5.0)
Alkaline Phosphatase: 61 U/L (ref 38–126)
Anion gap: 12 (ref 5–15)
BUN: 13 mg/dL (ref 6–20)
CO2: 23 mmol/L (ref 22–32)
Calcium: 9 mg/dL (ref 8.9–10.3)
Chloride: 108 mmol/L (ref 98–111)
Creatinine, Ser: 1.11 mg/dL — ABNORMAL HIGH (ref 0.44–1.00)
GFR, Estimated: 59 mL/min — ABNORMAL LOW (ref >60)
Glucose, Bld: 125 mg/dL — ABNORMAL HIGH (ref 70–99)
Potassium: 3.7 mmol/L (ref 3.5–5.1)
Sodium: 143 mmol/L (ref 135–145)
Total Bilirubin: 0.3 mg/dL (ref 0.3–1.2)
Total Protein: 6.4 g/dL — ABNORMAL LOW (ref 6.5–8.1)

## 2022-03-07 LAB — CBC WITH DIFFERENTIAL/PLATELET
Abs Immature Granulocytes: 0.02 10*3/uL (ref 0.00–0.07)
Basophils Absolute: 0.1 10*3/uL (ref 0.0–0.1)
Basophils Relative: 1 %
Eosinophils Absolute: 0.3 10*3/uL (ref 0.0–0.5)
Eosinophils Relative: 4 %
HCT: 39.1 % (ref 36.0–46.0)
Hemoglobin: 12.2 g/dL (ref 12.0–15.0)
Immature Granulocytes: 0 %
Lymphocytes Relative: 22 %
Lymphs Abs: 2 10*3/uL (ref 0.7–4.0)
MCH: 27.4 pg (ref 26.0–34.0)
MCHC: 31.2 g/dL (ref 30.0–36.0)
MCV: 87.9 fL (ref 80.0–100.0)
Monocytes Absolute: 0.6 10*3/uL (ref 0.1–1.0)
Monocytes Relative: 7 %
Neutro Abs: 6.1 10*3/uL (ref 1.7–7.7)
Neutrophils Relative %: 66 %
Platelets: 229 10*3/uL (ref 150–400)
RBC: 4.45 MIL/uL (ref 3.87–5.11)
RDW: 13.9 % (ref 11.5–15.5)
WBC: 9.1 10*3/uL (ref 4.0–10.5)
nRBC: 0 % (ref 0.0–0.2)

## 2022-03-07 MED ORDER — CEFADROXIL 500 MG PO CAPS: 500.0000 mg | ORAL_CAPSULE | Freq: Two times a day (BID) | ORAL | 0 refills | Status: AC

## 2022-03-07 MED ORDER — DIPHENHYDRAMINE HCL 50 MG/ML IJ SOLN
25.0000 mg | Freq: Once | INTRAMUSCULAR | Status: AC
Start: 2022-03-07 — End: 2022-03-07
  Administered 2022-03-07: 25 mg via INTRAVENOUS
  Filled 2022-03-07: qty 1

## 2022-03-07 MED ORDER — METHYLPREDNISOLONE SODIUM SUCC 125 MG IJ SOLR
125.0000 mg | Freq: Every day | INTRAMUSCULAR | Status: DC
  Administered 2022-03-07: 125 mg via INTRAVENOUS
  Filled 2022-03-07: qty 2

## 2022-03-07 MED ORDER — DEXTROSE 5 % IV SOLN
1500.0000 mg | Freq: Once | INTRAVENOUS | Status: AC
  Administered 2022-03-07: 1500 mg via INTRAVENOUS
  Filled 2022-03-07: qty 75

## 2022-03-07 MED ORDER — ONDANSETRON 4 MG PO TBDP: 4.0000 mg | ORAL_TABLET | Freq: Three times a day (TID) | ORAL | 0 refills | Status: DC | PRN

## 2022-03-07 MED ORDER — DOXYCYCLINE HYCLATE 100 MG PO CAPS
100.0000 mg | ORAL_CAPSULE | Freq: Two times a day (BID) | ORAL | 0 refills | Status: DC
Start: 2022-03-07 — End: 2022-03-14

## 2022-03-07 MED ORDER — EPINEPHRINE 0.3 MG/0.3ML IJ SOAJ: 0.3000 mg | INTRAMUSCULAR | 0 refills | Status: DC | PRN

## 2022-03-07 MED ORDER — EPINEPHRINE 0.3 MG/0.3ML IJ SOAJ
INTRAMUSCULAR | Status: DC
Start: 2022-03-07 — End: 2022-03-07
  Filled 2022-03-07: qty 0.3

## 2022-03-07 MED ORDER — EPINEPHRINE 0.3 MG/0.3ML IJ SOAJ
0.3000 mg | Freq: Once | INTRAMUSCULAR | Status: AC
  Administered 2022-03-07: 0.3 mg via INTRAMUSCULAR

## 2022-03-07 MED ORDER — DOXYCYCLINE HYCLATE 100 MG PO CAPS
100.0000 mg | ORAL_CAPSULE | Freq: Two times a day (BID) | ORAL | 0 refills | Status: DC
Start: 2022-03-07 — End: 2022-03-07

## 2022-03-07 MED ORDER — CEFADROXIL 500 MG PO CAPS: 500.0000 mg | ORAL_CAPSULE | Freq: Two times a day (BID) | ORAL | 0 refills | Status: DC

## 2022-03-07 NOTE — ED Notes (Signed)
Pt was using the restroom and states she noticed she broke out in hives on her abdomen and arms. Pt reports some difficulty breathing, airway is patent. Some wheezing noted. Patient moved into another room and placed on full cardiac monitor.

## 2022-03-07 NOTE — ED Triage Notes (Signed)
Pt reports finding a small "abscess" on her lower leg.  She did attempt to drain it.  It now apperars red, hot to the touch w/ a black "spot" in the middle.  Pt has taken 4 does of her daughter's antibiotic and tylenol w/ no relief. Lindsay Mcmillan she started to feel poorly w/ a headache, nausea/emesis, fatigue and increased leg pain.

## 2022-03-07 NOTE — ED Provider Notes (Addendum)
[facility] EMERGENCY DEPARTMENT Provider Note  CSN: [id number] Arrival date & time: 03/07/22 6314  Chief Complaint(s) leg abscess  HPI Lindsay Mcmillan is a 51 y.o. female with PMH COPD, HTN, previous opioid dependence currently on Suboxone who presents emergency department for evaluation of right lower extremity rash.  Patient states that she went camping approximately 5 days ago and developed a progressive painful red rash over the right anterior shin.  She took 2 days of her daughter's home doxycycline but has developed associated nausea, chills and generalized fatigue.  She has also noticed an increase in the size of the rash.  Unknown if patient suffered an insect bite or tick bite.  Currently denies chest pain, shortness of breath, headache, fever or other systemic symptoms.   Past Medical History Past Medical History:  Diagnosis Date   BOILS, RECURRENT 10/10/2009   Qualifier: Diagnosis of  By: De Blanch.    Chest pain in adult 09/13/2019   COPD (chronic obstructive pulmonary disease) (HCC)    Encounter for screening mammogram for malignant neoplasm of breast 08/11/2019   Tobacco dependence    Patient Active Problem List   Diagnosis Date Noted   Inguinal pain, right 12/09/2020   Disturbance of sleep 06/15/2020   Dizziness 05/16/2020   Essential hypertension 04/20/2020   Snoring 04/20/2020   Depression 04/20/2020   Family history of bipolar disorder 04/20/2020   Nicotine abuse 04/20/2020   Need for immunization against influenza 04/20/2020   Apnea 08/11/2019   Chronic obstructive pulmonary disease (HCC) 08/11/2019   EPIGASTRIC PAIN 10/10/2009   Nausea and vomiting 10/02/2009   Home Medication(s) Prior to Admission medications   Medication Sig Start Date End Date Taking? Authorizing Provider  acetaminophen (TYLENOL) 500 MG tablet Take 1,000 mg by mouth every 6 (six) hours as needed for mild pain or headache.    [provider]   buprenorphine-naloxone (SUBOXONE) 8-2 mg SUBL SL tablet Place 1 tablet under the tongue daily.    [provider]  ibuprofen (ADVIL,MOTRIN) 200 MG tablet Take 800 mg by mouth every 6 (six) hours as needed for headache or moderate pain.    [provider]  lisinopril-hydrochlorothiazide (ZESTORETIC) 10-12.5 MG tablet TAKE 1 TABLET BY MOUTH EVERY DAY 02/07/21   Heather Roberts, NP  meclizine (ANTIVERT) 25 MG tablet Take 1 tablet (25 mg total) by mouth 2 (two) times daily as needed for dizziness. 12/09/20   Anabel Halon, MD  ondansetron (ZOFRAN-ODT) 4 MG disintegrating tablet Take by mouth. Patient not taking: Reported on 12/09/2020 05/22/20   [provider]  Dwyane Luo 100-62.5-25 MCG/INH AEPB INHALE 1 DOSE INTO THE LUNGS DAILY BEFORE BREAKFAST. 11/28/20   Heather Roberts, NP                                                                                                                                    Past  Surgical History Past Surgical History:  Procedure Laterality Date   ABDOMINAL HYSTERECTOMY     CESAREAN SECTION     Family History Family History  Problem Relation Age of Onset   Diabetes Other    Cancer Other     Social History Social History   Tobacco Use   Smoking status: Every Day    Packs/day: 2.00    Years: 30.00    Total pack years: 60.00    Types: Cigarettes   Smokeless tobacco: Never  Vaping Use   Vaping Use: Former  Substance Use Topics   Alcohol use: No   Drug use: No   Allergies Patient has no known allergies.  Review of Systems Review of Systems  Skin:  Positive for rash.    Physical Exam Vital Signs  I have reviewed the triage vital signs BP (!) 170/87 (BP Location: Right Arm)   Pulse 84   Temp 98.4 F (36.9 C) (Oral)   Resp 16   SpO2 93%   Physical Exam Vitals and nursing note reviewed.  Constitutional:      General: She is not in acute distress.    Appearance: She is well-developed.  HENT:     Head:  Normocephalic and atraumatic.  Eyes:     Conjunctiva/sclera: Conjunctivae normal.  Cardiovascular:     Rate and Rhythm: Normal rate and regular rhythm.     Heart sounds: No murmur heard. Pulmonary:     Effort: Pulmonary effort is normal. No respiratory distress.     Breath sounds: Normal breath sounds.  Abdominal:     Palpations: Abdomen is soft.     Tenderness: There is no abdominal tenderness.  Musculoskeletal:        General: No swelling.     Cervical back: Neck supple.  Skin:    General: Skin is warm and dry.     Capillary Refill: Capillary refill takes less than 2 seconds.     Findings: Rash present.  Neurological:     Mental Status: She is alert.  Psychiatric:        Mood and Affect: Mood normal.        ED Results and Treatments Labs (all labs ordered are listed, but only abnormal results are displayed) Labs Reviewed  COMPREHENSIVE METABOLIC PANEL - Abnormal; Notable for the following components:      Result Value   Glucose, Bld 125 (*)    Creatinine, Ser 1.11 (*)    Total Protein 6.4 (*)    Albumin 3.4 (*)    GFR, Estimated 59 (*)    All other components within normal limits  CBC WITH DIFFERENTIAL/PLATELET                                                                                                                          Radiology No results found.  Pertinent labs & imaging results that were available during my care of the patient were reviewed by me and considered in my  medical decision making (see MDM for details).  Medications Ordered in ED Medications  dalbavancin (DALVANCE) 1,500 mg in dextrose 5 % 500 mL IVPB (has no administration in time range)                                                                                                                                     Procedures .Critical Care  Performed by: Glendora Score, MD Authorized by: Glendora Score, MD   Critical care provider statement:    Critical care time (minutes):   30   Critical care was necessary to treat or prevent imminent or life-threatening deterioration of the following conditions: Anaphylaxis requiring epinephrine.   Critical care was time spent personally by me on the following activities:  Development of treatment plan with patient or surrogate, discussions with consultants, evaluation of patient's response to treatment, examination of patient, ordering and review of laboratory studies, ordering and review of radiographic studies, ordering and performing treatments and interventions, pulse oximetry, re-evaluation of patient's condition and review of old charts   (including critical care time)  Medical Decision Making / ED Course   This patient presents to the ED for concern of rash, this involves an extensive number of treatment options, and is a complaint that carries with it a high risk of complications and morbidity.  The differential diagnosis includes cellulitis, erythema migrans/Lyme disease, tularemia, retained foreign body, necrotizing fasciitis  MDM: Seen in the emergency room for evaluation of a rash.  Physical exam with a 5 cm area of erythema with a central area of violaceous eruption.  Does not entirely appear to be an eschar at this time.  Laboratory evaluation with no leukocytosis and a mild creatinine elevation to 1.11 which is minimally elevated from baseline.  Given that she appears to be failing her initial course of outpatient doxycycline but is otherwise hemodynamically stable and would not require hospital admission, she is a perfect candidate for dalbavancin use in the emergency department and discharged on oral antibiotics with outpatient follow-up.  Patient is agreeable to this plan and was discharged on Duricef and doxycycline to maintain tickborne illness and MRSA coverage but expand for appropriate strep coverage due to poor doxycycline strep coverage in our antibiogram.  Of note, after discharge while walking to the  bathroom, patient broke out into hives and had acute onset shortness of breath with wheezing consistent with anaphylaxis.  She returned to her bed and received intramuscular epinephrine, steroids and Benadryl and her symptoms improved dramatically.  Patient was observed in the emergency department for 3 hours with no return of symptoms.  Medication added to patient's allergy list and epinephrine provided via prescription for home use.   Additional history obtained: -Additional history obtained from daughter -External records from outside source obtained and reviewed including: Chart review including previous notes, labs, imaging, consultation notes   Lab Tests: -I ordered, reviewed, and interpreted labs.   The  pertinent results include:   Labs Reviewed  COMPREHENSIVE METABOLIC PANEL - Abnormal; Notable for the following components:      Result Value   Glucose, Bld 125 (*)    Creatinine, Ser 1.11 (*)    Total Protein 6.4 (*)    Albumin 3.4 (*)    GFR, Estimated 59 (*)    All other components within normal limits  CBC WITH DIFFERENTIAL/PLATELET    Medicines ordered and prescription drug management: Meds ordered this encounter  Medications   dalbavancin (DALVANCE) 1,500 mg in dextrose 5 % 500 mL IVPB    Order Specific Question:   Indication:    Answer:   Cellulitis    -I have reviewed the patients home medicines and have made adjustments as needed  Critical interventions none  Cardiac Monitoring: The patient was maintained on a cardiac monitor.  I personally viewed and interpreted the cardiac monitored which showed an underlying rhythm of: NSR  Social Determinants of Health:  Factors impacting patients care include: Recent camping trip   Reevaluation: After the interventions noted above, I reevaluated the patient and found that they have :stayed the same  Co morbidities that complicate the patient evaluation  Past Medical History:  Diagnosis Date   BOILS, RECURRENT  10/10/2009   Qualifier: Diagnosis of  By: De Blanch.    Chest pain in adult 09/13/2019   COPD (chronic obstructive pulmonary disease) (HCC)    Encounter for screening mammogram for malignant neoplasm of breast 08/11/2019   Tobacco dependence       Dispostion: I considered admission for this patient, but she does not meet inpatient criteria for admission and is safe for discharge with outpatient oral antibiotic expansion and return precautions of which she voiced understanding     Final Clinical Impression(s) / ED Diagnoses Final diagnoses:  Cellulitis     @PCDICTATION @    , MD 03/07/22 03/09/22    [id number], MD 03/07/22 1949

## 2022-03-07 NOTE — ED Notes (Signed)
Hive, itching, and wheezing have significantly improved

## 2022-03-07 NOTE — Progress Notes (Signed)
Pharmacy Antibiotic Note  Lindsay Mcmillan is a 51 y.o. female admitted on 03/07/2022 with cellulitis.  Pharmacy has been consulted for dalbavancin dosing. Patient has a small abscess on lower leg with redness and warm to the touch. Patient's WBC is 9.1, afebrile, and normal renal function. Patient has a history of recurrent abscess with cx data showing staph lugdunesis. Per chart review, it appears patient has been following with [facility] but was a no show to her most recent appointment on 02/09/2022.   Plan: 1x dose of  dalbavancin 1500mg   ID CX placed   Temp (24hrs), Avg:98.4 F (36.9 C), Min:98.4 F (36.9 C), Max:98.4 F (36.9 C)  Recent Labs  Lab 03/07/22 0604  WBC 9.1  CREATININE 1.11*    CrCl cannot be calculated (Unknown ideal weight.).    No Known Allergies   Thank you for allowing pharmacy to be a part of this patient's care.  03/09/22 03/07/2022 9:00 AM

## 2022-03-14 ENCOUNTER — Emergency Department (HOSPITAL_COMMUNITY)
Admission: EM | Admit: 2022-03-14 | Discharge: 2022-03-14 | Disposition: A | Discharge status: Home / Self Care | Payer: Self-pay | Attending: Emergency Medicine | Admitting: Emergency Medicine

## 2022-03-14 ENCOUNTER — Encounter (HOSPITAL_COMMUNITY): Payer: Self-pay

## 2022-03-14 ENCOUNTER — Other Ambulatory Visit: Payer: Self-pay

## 2022-03-14 DIAGNOSIS — J449 Chronic obstructive pulmonary disease, unspecified: Secondary | ICD-10-CM | POA: Insufficient documentation

## 2022-03-14 DIAGNOSIS — L03115 Cellulitis of right lower limb: Secondary | ICD-10-CM

## 2022-03-14 MED ORDER — DOXYCYCLINE HYCLATE 100 MG PO CAPS: 100.0000 mg | ORAL_CAPSULE | Freq: Two times a day (BID) | ORAL | 0 refills | Status: DC

## 2022-03-14 NOTE — ED Provider Notes (Addendum)
[facility] EMERGENCY DEPARTMENT Provider Note   CSN: [id number] Arrival date & time: 03/14/22  1544     History  Chief Complaint  Patient presents with   Insect Bite    Lindsay Mcmillan is a 51 y.o. female.  51 year old female presents today for evaluation of wound to right lower extremity.  She was previously seen and evaluated in the emergency room.  She returns today due to did not have any improvement of the erythema.  Reports slight worsening of the central wound.  Endorses minimal drainage.  Denies fever, chills.  Has not completed antibiotics previously due to cost.  Has follow-up with infectious disease upcoming in the near future.  The history is provided by the patient. No language interpreter was used.       Home Medications Prior to Admission medications   Medication Sig Start Date End Date Taking? Authorizing Provider  acetaminophen (TYLENOL) 500 MG tablet Take 1,000 mg by mouth every 6 (six) hours as needed for mild pain or headache.    [provider]  buprenorphine-naloxone (SUBOXONE) 8-2 mg SUBL SL tablet Place 1 tablet under the tongue daily.    [provider]  cefadroxil (DURICEF) 500 MG capsule Take 1 capsule (500 mg total) by mouth 2 (two) times daily for 7 days. 03/07/22 03/14/22  Kommor, [place], MD  doxycycline (VIBRAMYCIN) 100 MG capsule Take 1 capsule (100 mg total) by mouth 2 (two) times daily. 03/07/22   Kommor, [place], MD  EPINEPHrine 0.3 mg/0.3 mL IJ SOAJ injection Inject 0.3 mg into the muscle as needed for anaphylaxis. 03/07/22   Kommor, [place], MD  ibuprofen (ADVIL,MOTRIN) 200 MG tablet Take 800 mg by mouth every 6 (six) hours as needed for headache or moderate pain.    [provider]  lisinopril-hydrochlorothiazide (ZESTORETIC) 10-12.5 MG tablet TAKE 1 TABLET BY MOUTH EVERY DAY 02/07/21   Heather Roberts, NP  meclizine (ANTIVERT) 25 MG tablet Take 1 tablet (25 mg total) by mouth 2 (two) times daily as needed for dizziness.  12/09/20   Anabel Halon, MD  ondansetron (ZOFRAN-ODT) 4 MG disintegrating tablet Take 1 tablet (4 mg total) by mouth every 8 (eight) hours as needed for nausea or vomiting. 03/07/22   Kommor, [place], MD  TRELEGY ELLIPTA 100-62.5-25 MCG/INH AEPB INHALE 1 DOSE INTO THE LUNGS DAILY BEFORE BREAKFAST. 11/28/20   Heather Roberts, NP      Allergies    Dalbavancin    Review of Systems   Review of Systems  Constitutional:  Negative for fever.  Skin:  Positive for wound.  All other systems reviewed and are negative.   Physical Exam Updated Vital Signs BP (!) 140/100 (BP Location: Right Arm)   Pulse 90   Temp 98.1 F (36.7 C) (Oral)   Resp 18   Ht 5\' 8"  (1.727 m)   Wt 84.4 kg   SpO2 99%   BMI 28.28 kg/m  Physical Exam Vitals and nursing note reviewed.  Constitutional:      General: She is not in acute distress.    Appearance: Normal appearance. She is not ill-appearing.  HENT:     Head: Normocephalic and atraumatic.     Nose: Nose normal.  Eyes:     Conjunctiva/sclera: Conjunctivae normal.  Pulmonary:     Effort: Pulmonary effort is normal. No respiratory distress.  Musculoskeletal:        General: No deformity.  Skin:    Findings: No rash.     Comments: Erythema noted  to right lower extremity.  Central wound noted.  Minimal discharge.  Right lower extremity also has mild pitting edema.  2+ DP pulse present.  Brisk cap refill.  Edema seems to be confined within the border that was marked last ER visit.  Neurological:     Mental Status: She is alert.     ED Results / Procedures / Treatments   Labs (all labs ordered are listed, but only abnormal results are displayed) Labs Reviewed - No data to display  EKG None  Radiology No results found.  Procedures Procedures    Medications Ordered in ED Medications - No data to display  ED Course/ Medical Decision Making/ A&P                           Medical Decision Making Risk Prescription drug  management.   Medical Decision Making / ED Course   This patient presents to the ED for concern of wound to right lower extremity, this involves an extensive number of treatment options, and is a complaint that carries with it a high risk of complications and morbidity.  The differential diagnosis includes cellulitis, bruising  MDM: 51 year old female presents today for evaluation of persistent right lower extremity erythema.  She was evaluated 1 week ago at [facility] emergency room.  She was given dose of Dalvance and discharged with 2 additional antibiotics.  She states due to cost she was not able to pick up the antibiotics that were prescribed at discharge.  She states she would be able to pick up any additional antibiotics that are prescribed that are within $20 range.  She is without signs of systemic infection such as fever.  Erythema has not progressed since discharge from the emergency room last week.  She does have follow-up with infectious disease coming up next Monday.  Right lower extremity does have trace pitting edema.  She does not have history of DVT/PE.  Denies recent surgery, or recent long travel.  States this was also present last ER visit.  Has not gotten worse since then.  Denies chest pain, shortness of breath.  Low suspicion for DVT.  Will provide prescription for doxycycline to complete 7-day course.  Return precautions discussed.  Patient voices understanding and is in agreement with plan.   Additional history obtained: -Additional history obtained from Recent ED visit -External records from outside source obtained and reviewed including: Chart review including previous notes, labs, imaging, consultation notes   Lab Tests: -I ordered, reviewed, and interpreted labs.   The pertinent results include:   Labs Reviewed - No data to display    EKG  EKG Interpretation  Date/Time:    Ventricular Rate:    PR Interval:    QRS Duration:   QT Interval:    QTC Calculation:    R Axis:     Text Interpretation:           Medicines ordered and prescription drug management: No orders of the defined types were placed in this encounter.   -I have reviewed the patients home medicines and have made adjustments as needed  Reevaluation: After the interventions noted above, I reevaluated the patient and found that they have :stayed the same  Co morbidities that complicate the patient evaluation  Past Medical History:  Diagnosis Date   BOILS, RECURRENT 10/10/2009   Qualifier: Diagnosis of  By: De Blanch.    Chest pain in adult 09/13/2019   COPD (chronic  obstructive pulmonary disease) (HCC)    Encounter for screening mammogram for malignant neoplasm of breast 08/11/2019   Tobacco dependence       Dispostion: Patient is appropriate for discharge.  Discharged in stable condition.  Return precautions discussed.   Final Clinical Impression(s) / ED Diagnoses Final diagnoses:  Cellulitis of right lower extremity    Rx / DC Orders ED Discharge Orders          Ordered    doxycycline (VIBRAMYCIN) 100 MG capsule  2 times daily        03/14/22 1724              Marita Kansas, PA-C 03/14/22 1724    Marita Kansas, PA-C 03/24/22 2157    Eber Hong, MD 03/24/22 2202

## 2022-03-14 NOTE — ED Triage Notes (Signed)
Pt to er, pt states that she was seen at [facility] for an insect bite, states that she was given iv abx and written for some oral abx, states that she hasn't gotten the script filled because she doesn't have any money, states that she is here to get it re evaluated.

## 2022-03-14 NOTE — Discharge Instructions (Signed)
Your exam today was overall reassuring.  The redness has not spread since your last ER visit.  The central area appears to be healing.  You are without a fever.  I have sent in doxycycline 7-day course.  Using GoodRx this should be less than $20.  You can take this to a pharmacy of your choice.  For any worsening symptoms such as fever, significant spread and the redness please return for evaluation.  Otherwise follow-up with your infectious disease doctor.

## 2022-04-02 ENCOUNTER — Other Ambulatory Visit (HOSPITAL_COMMUNITY): Payer: Self-pay

## 2022-07-06 DIAGNOSIS — F1590 Other stimulant use, unspecified, uncomplicated: Secondary | ICD-10-CM | POA: Insufficient documentation

## 2022-07-06 DIAGNOSIS — R252 Cramp and spasm: Secondary | ICD-10-CM | POA: Insufficient documentation

## 2022-09-02 ENCOUNTER — Other Ambulatory Visit: Payer: Self-pay

## 2022-09-02 ENCOUNTER — Encounter (HOSPITAL_COMMUNITY): Payer: Self-pay | Admitting: Emergency Medicine

## 2022-09-02 DIAGNOSIS — L089 Local infection of the skin and subcutaneous tissue, unspecified: Secondary | ICD-10-CM | POA: Diagnosis present

## 2022-09-02 DIAGNOSIS — Z5321 Procedure and treatment not carried out due to patient leaving prior to being seen by health care provider: Secondary | ICD-10-CM | POA: Insufficient documentation

## 2022-09-02 NOTE — ED Triage Notes (Signed)
Patient has wound to right lower leg that appeared x8 months ago from possible brown recluse spider. Patient being treated by infection disease at [facility] in which she was told will possible take 1.5-2 years. Patient states increase redness and pain x2-3 months with strong odor that is progressively getting worse. Denies any known fevers.

## 2022-09-03 ENCOUNTER — Emergency Department (HOSPITAL_COMMUNITY)
Admission: EM | Admit: 2022-09-03 | Discharge: 2022-09-03 | Disposition: A | Discharge status: Home / Self Care | Payer: Medicaid Other | Attending: Emergency Medicine | Admitting: Emergency Medicine

## 2022-09-03 HISTORY — DX: Other psychoactive substance abuse, uncomplicated: F19.10

## 2022-09-03 HISTORY — DX: Disruption of wound, unspecified, initial encounter: T81.30XA

## 2022-09-04 ENCOUNTER — Ambulatory Visit: Payer: Medicaid Other | Admitting: Cardiology

## 2022-09-04 NOTE — Progress Notes (Deleted)
    Cardiology Office Note  Date: 09/04/2022   ID: Lindsay Mcmillan, DOB 10-Nov-1970, MRN [id number]  History of Present Illness: Lindsay Mcmillan is a 52 y.o. female seen in consultation back in April 2021  I reviewed the interval chart, she is following through [facility] with history of methamphetamine and cocaine abuse, depression and anxiety, hypertension and COPD.  Recent office note from February 7 from Dr. Drema Dallas reviewed.  I see that she had a dobutamine echocardiogram done through [facility] in March of last year which was negative for ischemia.  She has not had a recent lipid panel.  Physical Exam: VS:  There were no vitals taken for this visit., BMI There is no height or weight on file to calculate BMI.  Wt Readings from Last 3 Encounters:  09/02/22 208 lb (94.3 kg)  03/14/22 186 lb (84.4 kg)  12/09/20 179 lb 6.4 oz (81.4 kg)    General: Patient appears comfortable at rest. HEENT: Conjunctiva and lids normal, oropharynx clear with moist mucosa. Neck: Supple, no elevated JVP or carotid bruits, no thyromegaly. Lungs: Clear to auscultation, nonlabored breathing at rest. Cardiac: Regular rate and rhythm, no S3 or significant systolic murmur, no pericardial rub. Abdomen: Soft, nontender, no hepatomegaly, bowel sounds present, no guarding or rebound. Extremities: No pitting edema, distal pulses 2+. Skin: Warm and dry. Musculoskeletal: No kyphosis. Neuropsychiatric: Alert and oriented x3, affect grossly appropriate.  ECG:  An ECG dated 09/09/2019 was personally reviewed today and demonstrated:  Sinus rhythm.  Labwork: 03/07/2022: ALT 28; AST 17; BUN 13; Creatinine, Ser 1.11; Hemoglobin 12.2; Platelets 229; Potassium 3.7; Sodium 143   Other Studies Reviewed Today:  Chest CT 11/17/2019: IMPRESSION: No acute findings or active disease within the thorax.   Mild centrilobular emphysema.   Emphysema (ICD10-J43.9).  Abdominal and pelvic CT  12/22/2020: IMPRESSION: 1. No acute intra-abdominal or pelvic pathology. No bowel obstruction. Normal appendix. 2. No hernia identified in the right lower quadrant or right groin. 3. Mild fatty liver. 4. Aortic Atherosclerosis (ICD10-I70.0).  Dobutamine echocardiogram 09/14/2021 ([facility]): SUMMARY  The baseline ECG displays normal sinus rhythm.  No diagnostic ST segment changes were seen.  There were frequent PVCs including runs of trigeminy. There was a 6  beat VT at peak infusion. There were also frequent PACs and short runs  of SVT  Negative stress ECG for inducible ischemia at target heart rate.  Normal left ventricular function at rest.  There were no segmental wall motion abnormalities at rest  There was normal augmentation of all left ventricular wall segments  with dobutamine.  There were no segmental wall motion abnormalities with dobutamine.  Negative dobutamine echocardiography for inducible ischemia at target  heart rate.   Assessment and Plan:    Disposition:  Follow up {follow up:15908}  Signed, Satira Sark, M.D., F.A.C.C.

## 2022-09-08 ENCOUNTER — Other Ambulatory Visit: Payer: Self-pay

## 2022-09-08 ENCOUNTER — Emergency Department (HOSPITAL_COMMUNITY): Payer: Medicaid Other

## 2022-09-08 ENCOUNTER — Emergency Department (HOSPITAL_COMMUNITY)
Admission: EM | Admit: 2022-09-08 | Discharge: 2022-09-08 | Disposition: A | Discharge status: Home / Self Care | Payer: Medicaid Other | Attending: Emergency Medicine | Admitting: Emergency Medicine

## 2022-09-08 ENCOUNTER — Encounter (HOSPITAL_COMMUNITY): Payer: Self-pay | Admitting: Pharmacy Technician

## 2022-09-08 DIAGNOSIS — J449 Chronic obstructive pulmonary disease, unspecified: Secondary | ICD-10-CM | POA: Diagnosis not present

## 2022-09-08 DIAGNOSIS — F17200 Nicotine dependence, unspecified, uncomplicated: Secondary | ICD-10-CM | POA: Diagnosis not present

## 2022-09-08 DIAGNOSIS — S81801D Unspecified open wound, right lower leg, subsequent encounter: Secondary | ICD-10-CM | POA: Insufficient documentation

## 2022-09-08 DIAGNOSIS — W57XXXD Bitten or stung by nonvenomous insect and other nonvenomous arthropods, subsequent encounter: Secondary | ICD-10-CM | POA: Insufficient documentation

## 2022-09-08 MED ORDER — DOXYCYCLINE HYCLATE 100 MG PO CAPS: 100.0000 mg | ORAL_CAPSULE | Freq: Two times a day (BID) | ORAL | 0 refills | Status: AC

## 2022-09-08 NOTE — ED Provider Notes (Addendum)
[facility] Provider Note   CSN: [id number] Arrival date & time: 09/08/22  [id number]     History  Chief Complaint  Patient presents with   Insect Bite    Lindsay Mcmillan is a 52 y.o. female.  Patient with a history of chronic wound to the right lower extremity.  Is been ongoing for several months.  Seen by [place] twice in September 2023 and also seen by infectious disease at [facility].  Patient at her second visit here was treated with doxycycline.  Patient states that it is starting to ooze again.  Chronic wound appearance.  Has not had osteomyelitis in the past.  Will get x-ray.  Patient without any fevers or significant systemic symptoms.  Patient has a history of substance abuse disorder.  And COPD.  Patient has not seen infectious disease since September.  Patient still an everyday smoker.       Home Medications Prior to Admission medications   Medication Sig Start Date End Date Taking? Authorizing Provider  acetaminophen (TYLENOL) 500 MG tablet Take 1,000 mg by mouth every 6 (six) hours as needed for mild pain or headache.    [provider]  buprenorphine-naloxone (SUBOXONE) 8-2 mg SUBL SL tablet Place 1 tablet under the tongue daily.    [provider]  doxycycline (VIBRAMYCIN) 100 MG capsule Take 1 capsule (100 mg total) by mouth 2 (two) times daily. 03/14/22   Evlyn Courier, PA-C  EPINEPHrine 0.3 mg/0.3 mL IJ SOAJ injection Inject 0.3 mg into the muscle as needed for anaphylaxis. 03/07/22   Kommor, [place], MD  ibuprofen (ADVIL,MOTRIN) 200 MG tablet Take 800 mg by mouth every 6 (six) hours as needed for headache or moderate pain.    [provider]  lisinopril-hydrochlorothiazide (ZESTORETIC) 10-12.5 MG tablet TAKE 1 TABLET BY MOUTH EVERY DAY 02/07/21   Noreene Larsson, NP  meclizine (ANTIVERT) 25 MG tablet Take 1 tablet (25 mg total) by mouth 2 (two) times daily as needed for dizziness. 12/09/20   Lindell Spar, MD  ondansetron (ZOFRAN-ODT) 4 MG disintegrating tablet Take 1 tablet (4 mg total) by mouth every 8 (eight) hours as needed for nausea or vomiting. 03/07/22   Kommor, [place], MD  TRELEGY ELLIPTA 100-62.5-25 MCG/INH AEPB INHALE 1 DOSE INTO THE LUNGS DAILY BEFORE BREAKFAST. 11/28/20   Noreene Larsson, NP      Allergies    Dalbavancin and Vancomycin    Review of Systems   Review of Systems  Constitutional:  Negative for chills and fever.  HENT:  Negative for ear pain and sore throat.   Eyes:  Negative for pain and visual disturbance.  Respiratory:  Negative for cough and shortness of breath.   Cardiovascular:  Negative for chest pain and palpitations.  Gastrointestinal:  Negative for abdominal pain and vomiting.  Genitourinary:  Negative for dysuria and hematuria.  Musculoskeletal:  Negative for arthralgias and back pain.  Skin:  Positive for wound. Negative for color change and rash.  Neurological:  Negative for seizures and syncope.  All other systems reviewed and are negative.   Physical Exam Updated Vital Signs BP (!) 172/91   Pulse 77   Temp 98.1 F (36.7 C) (Oral)   Resp 16   SpO2 95%  Physical Exam Vitals and nursing note reviewed.  Constitutional:      General: She is not in acute distress.    Appearance: Normal appearance. She is well-developed.  HENT:  Head: Normocephalic and atraumatic.  Eyes:     Extraocular Movements: Extraocular movements intact.     Conjunctiva/sclera: Conjunctivae normal.     Pupils: Pupils are equal, round, and reactive to light.  Cardiovascular:     Rate and Rhythm: Normal rate and regular rhythm.     Heart sounds: No murmur heard. Pulmonary:     Effort: Pulmonary effort is normal. No respiratory distress.     Breath sounds: Normal breath sounds.  Abdominal:     Palpations: Abdomen is soft.     Tenderness: There is no abdominal tenderness.  Musculoskeletal:        General: Tenderness present. No swelling.     Cervical back:  Normal range of motion and neck supple.     Comments: Anterior distal right leg area with open wound no real purulent discharge but some serous discharge wound measuring about a centimeter.  An area of some skin peeling and erythema dark erythema measuring about 5 cm.  Mild edema to the right lower extremity.  Skin:    General: Skin is warm and dry.     Capillary Refill: Capillary refill takes less than 2 seconds.  Neurological:     Mental Status: She is alert.  Psychiatric:        Mood and Affect: Mood normal.     ED Results / Procedures / Treatments   Labs (all labs ordered are listed, but only abnormal results are displayed) Labs Reviewed - No data to display  EKG None  Radiology No results found.  Procedures Procedures    Medications Ordered in ED Medications - No data to display  ED Course/ Medical Decision Making/ A&P                             Medical Decision Making Amount and/or Complexity of Data Reviewed Radiology: ordered.   Patient with known chronic wound to this area.  Patient states is flaring up again.  Temp here 98 oxygen sats 95% heart rate 77 respirations 16 blood pressure elevated at 172/91.  Will get an x-ray to make sure no evidence of osteo myelitis to the tibia there.  If negative we will go ahead and put back on doxycycline course and have her follow-up with infectious disease at [facility] who is already following her for this.  Tib-fib x-ray right lower extremity without evidence of any osteomyelitis.  Although this does not completely rule it out MRI may be needed for additional evaluation will have patient follow back up with infectious disease at [facility] and will start her on doxycycline   Final Clinical Impression(s) / ED Diagnoses Final diagnoses:  Wound of right lower extremity, subsequent encounter    Rx / DC Orders ED Discharge Orders     None         Fredia Sorrow, MD 09/08/22 [place], [place], MD 09/08/22  [phone]

## 2022-09-08 NOTE — Discharge Instructions (Signed)
Take the antibiotic doxycycline as directed for the next 14 days.  Make an appointment to follow-up with infectious disease at [facility].  Return for any new or worse symptoms.  X-ray showed no bony involvement.  But as we discussed infectious disease may opt to do an MRI which is more sensitive for early infection in the bone.

## 2022-09-08 NOTE — ED Triage Notes (Signed)
Pt reports spider bite approx 8 months ago. Reports initially wound started to heal but over the last week she has noticed an increase in pain, foul odor and drainage from wound. Denies fevers.

## 2022-09-08 NOTE — ED Notes (Signed)
Patient Alert and oriented to baseline. Stable and ambulatory to baseline. Patient verbalized understanding of the discharge instructions.  Patient belongings were taken by the patient.   

## 2022-09-14 ENCOUNTER — Encounter (HOSPITAL_COMMUNITY): Payer: Self-pay | Admitting: Emergency Medicine

## 2022-09-14 ENCOUNTER — Emergency Department (HOSPITAL_COMMUNITY): Payer: Medicaid Other

## 2022-09-14 ENCOUNTER — Emergency Department (HOSPITAL_COMMUNITY)
Admission: EM | Admit: 2022-09-14 | Discharge: 2022-09-14 | Disposition: A | Discharge status: Home / Self Care | Payer: Medicaid Other | Attending: Emergency Medicine | Admitting: Emergency Medicine

## 2022-09-14 ENCOUNTER — Other Ambulatory Visit: Payer: Self-pay

## 2022-09-14 DIAGNOSIS — R079 Chest pain, unspecified: Secondary | ICD-10-CM | POA: Diagnosis present

## 2022-09-14 DIAGNOSIS — J449 Chronic obstructive pulmonary disease, unspecified: Secondary | ICD-10-CM | POA: Insufficient documentation

## 2022-09-14 DIAGNOSIS — F17210 Nicotine dependence, cigarettes, uncomplicated: Secondary | ICD-10-CM | POA: Diagnosis not present

## 2022-09-14 LAB — BASIC METABOLIC PANEL
Anion gap: 8 (ref 5–15)
BUN: 15 mg/dL (ref 6–20)
CO2: 28 mmol/L (ref 22–32)
Calcium: 9.4 mg/dL (ref 8.9–10.3)
Chloride: 105 mmol/L (ref 98–111)
Creatinine, Ser: 0.9 mg/dL (ref 0.44–1.00)
GFR, Estimated: >60 mL/min (ref >60)
Glucose, Bld: 102 mg/dL — ABNORMAL HIGH (ref 70–99)
Potassium: 3.7 mmol/L (ref 3.5–5.1)
Sodium: 141 mmol/L (ref 135–145)

## 2022-09-14 LAB — CBC
HCT: 43.1 % (ref 36.0–46.0)
Hemoglobin: 13.5 g/dL (ref 12.0–15.0)
MCH: 26.6 pg (ref 26.0–34.0)
MCHC: 31.3 g/dL (ref 30.0–36.0)
MCV: 85 fL (ref 80.0–100.0)
Platelets: 263 10*3/uL (ref 150–400)
RBC: 5.07 MIL/uL (ref 3.87–5.11)
RDW: 14.3 % (ref 11.5–15.5)
WBC: 9.5 10*3/uL (ref 4.0–10.5)
nRBC: 0 % (ref 0.0–0.2)

## 2022-09-14 LAB — TROPONIN I (HIGH SENSITIVITY)
Troponin I (High Sensitivity): 6 ng/L (ref <18)
Troponin I (High Sensitivity): 6 ng/L (ref <18)

## 2022-09-14 MED ORDER — MORPHINE SULFATE (PF) 4 MG/ML IV SOLN
4.0000 mg | Freq: Once | INTRAVENOUS | Status: AC
  Administered 2022-09-14: 4 mg via SUBCUTANEOUS
  Filled 2022-09-14: qty 1

## 2022-09-14 NOTE — ED Provider Notes (Signed)
[facility] Emergency Department Provider Note MRN:  [id number]  Arrival date & time: 09/14/22     Chief Complaint   Chest Pain   History of Present Illness   Lindsay Mcmillan is a 52 y.o. year-old female with a history of COPD, substance use disorder, tobacco use presenting to the ED with chief complaint of chest pain.  Left shoulder blade pain with radiation to the left chest starting a few hours ago.  Worse with certain positions, worse with movement of the left arm.  No dizziness, no diaphoresis, no nausea vomiting, no shortness of breath, no recent leg pain or swelling.  Review of Systems  A thorough review of systems was obtained and all systems are negative except as noted in the HPI and PMH.   Patient's Health History    Past Medical History:  Diagnosis Date   BOILS, RECURRENT 10/10/2009   Qualifier: Diagnosis of  By: Westly Pam.    Chest pain in adult 09/13/2019   COPD (chronic obstructive pulmonary disease) ([place])    Encounter for screening mammogram for malignant neoplasm of breast 08/11/2019   Substance abuse ([place])    Tobacco dependence    Wound dehiscence     Past Surgical History:  Procedure Laterality Date   ABDOMINAL HYSTERECTOMY     CESAREAN SECTION      Family History  Problem Relation Age of Onset   Diabetes Other    Cancer Other     Social History   Socioeconomic History   Marital status: Single    Spouse name: Not on file   Number of children: Not on file   Years of education: Not on file   Highest education level: Not on file  Occupational History   Not on file  Tobacco Use   Smoking status: Every Day    Packs/day: 2.00    Years: 30.00    Total pack years: 60.00    Types: Cigarettes   Smokeless tobacco: Never  Vaping Use   Vaping Use: Former  Substance and Sexual Activity   Alcohol use: No   Drug use: No   Sexual activity: Yes    Birth control/protection: None  Other Topics Concern   Not on file   Social History Narrative   Lives with boyfriend of 64 years      Enjoys: limited       Diet: eats all food groups    Caffeine: 20 oz of coke    Water: never       Wears seat    Does not use phone while driving    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   Does not Administrator   Social Determinants of Health   Financial Resource Strain: Low Risk  (04/20/2020)   Overall Financial Resource Strain (CARDIA)    Difficulty of Paying Living Expenses: Not hard at all  Food Insecurity: No Food Insecurity (04/20/2020)   Hunger Vital Sign    Worried About Running Out of Food in the Last Year: Never true    Ran Out of Food in the Last Year: Never true  Transportation Needs: No Transportation Needs (04/20/2020)   PRAPARE - Hydrologist (Medical): No    Lack of Transportation (Non-Medical): No  Physical Activity: Sufficiently Active (04/20/2020)   Exercise Vital Sign    Days of Exercise per Week: 4 days    Minutes of Exercise per Session: 60 min  Stress: Stress  Concern Present (04/20/2020)   [place]    Feeling of Stress : Very much  Social Connections: Moderately Integrated (04/20/2020)   Social Connection and Isolation Panel [NHANES]    Frequency of Communication with Friends and Family: More than three times a week    Frequency of Social Gatherings with Friends and Family: Once a week    Attends Religious Services: More than 4 times per year    Active Member of [employer] or Organizations: No    Attends Archivist Meetings: Never    Marital Status: Living with partner  Intimate Partner Violence: Not At Risk (04/20/2020)   Humiliation, Afraid, Rape, and Kick questionnaire    Fear of Current or Ex-Partner: No    Emotionally Abused: No    Physically Abused: No    Sexually Abused: No     Physical Exam   Vitals:   09/14/22 0028  BP: (!) 191/98  Pulse: 85  Resp: 18   Temp: 98 F (36.7 C)  SpO2: 93%    CONSTITUTIONAL: Well-appearing, NAD NEURO/PSYCH:  Alert and oriented x 3, no focal deficits EYES:  eyes equal and reactive ENT/NECK:  no LAD, no JVD CARDIO: Regular rate, well-perfused, normal S1 and S2 PULM:  CTAB no wheezing or rhonchi GI/GU:  non-distended, non-tender MSK/SPINE:  No gross deformities, no edema SKIN:  no rash, atraumatic   *Additional and/or pertinent findings included in MDM below  Diagnostic and Interventional Summary    EKG Interpretation  Date/Time:  Friday September 14 2022 00:30:24 EST Ventricular Rate:  80 PR Interval:  147 QRS Duration: 84 QT Interval:  373 QTC Calculation: 431 R Axis:   44 Text Interpretation: Sinus rhythm Atrial premature complexes Low voltage, precordial leads Confirmed by Gerlene Fee [phone]) on 09/14/2022 2:02:47 AM       Labs Reviewed  BASIC METABOLIC PANEL - Abnormal; Notable for the following components:      Result Value   Glucose, Bld 102 (*)    All other components within normal limits  CBC  TROPONIN I (HIGH SENSITIVITY)  TROPONIN I (HIGH SENSITIVITY)    DG Chest 2 View  Final Result      Medications  morphine (PF) 4 MG/ML injection 4 mg (4 mg Subcutaneous Given 09/14/22 0047)     Procedures  /  Critical Care Procedures  ED Course and Medical Decision Making  Initial Impression and Ddx Sharp pain worse with movement and certain positions, favoring MSK.  The pain is not pleuritic, she does not have any concerning associated symptoms.  ACS and PE are considered but felt to be much less likely.  Highly doubt dissection.  EKG is without concerning features.  Past medical/surgical history that increases complexity of ED encounter: COPD, tobacco abuse  Interpretation of Diagnostics I personally reviewed the EKG and my interpretation is as follows: Sinus rhythm without concerning ischemic findings  Labs reassuring with no significant blood count or electrolyte disturbance.   Troponin negative x 2.  Patient Reassessment and Ultimate Disposition/Management     Given the reassuring workup and suspicion for MSK etiology, patient is appropriate for discharge with follow-up.  Patient management required discussion with the following services or consulting groups:  None  Complexity of Problems Addressed Acute illness or injury that poses threat of life of bodily function  Additional Data Reviewed and Analyzed Further history obtained from: Past medical history and medications listed in the EMR and Prior labs/imaging results  Additional Factors  Impacting ED Encounter Risk None  Barth Kirks. Sedonia Small, MD [place] [email or web address]  Final Clinical Impressions(s) / ED Diagnoses     ICD-10-CM   1. Chest pain, unspecified type  R07.9       ED Discharge Orders     None        Discharge Instructions Discussed with and Provided to Patient:   Discharge Instructions   None      Maudie Flakes, MD 09/14/22 0202

## 2022-09-14 NOTE — ED Triage Notes (Signed)
Pt c/o left shoulder pain that started about 4 hrs PTA, pt states about 1 hr later she had left sided chest pain as well.   Pt states her PCP changed her BP meds about 1 week ago. Pt taken off of amlodipine and ordered HCTZ.

## 2022-09-14 NOTE — Discharge Instructions (Signed)
You were evaluated in the Emergency Department and after careful evaluation, we did not find any emergent condition requiring admission or further testing in the hospital.  Your exam/testing today was overall reassuring.  No signs of heart damage.  Follow-up with your regular doctor to discuss your symptoms and or need for stress testing in the future.  Please return to the Emergency Department if you experience any worsening of your condition.  Thank you for allowing [place] to be a part of your care.

## 2023-01-01 ENCOUNTER — Ambulatory Visit: Payer: Medicaid Other | Admitting: Family Medicine

## 2023-01-04 ENCOUNTER — Ambulatory Visit: Payer: Medicaid Other | Admitting: Family Medicine

## 2023-01-04 ENCOUNTER — Encounter: Payer: Self-pay | Admitting: Family Medicine

## 2023-01-04 VITALS — BP 144/80 | HR 70 | Temp 97.5°F | Ht 68.0 in | Wt 195.0 lb

## 2023-01-04 DIAGNOSIS — G4719 Other hypersomnia: Secondary | ICD-10-CM | POA: Diagnosis not present

## 2023-01-04 DIAGNOSIS — R252 Cramp and spasm: Secondary | ICD-10-CM | POA: Diagnosis not present

## 2023-01-04 DIAGNOSIS — F1911 Other psychoactive substance abuse, in remission: Secondary | ICD-10-CM

## 2023-01-04 DIAGNOSIS — R42 Dizziness and giddiness: Secondary | ICD-10-CM | POA: Diagnosis not present

## 2023-01-04 DIAGNOSIS — Z13220 Encounter for screening for lipoid disorders: Secondary | ICD-10-CM

## 2023-01-04 DIAGNOSIS — I10 Essential (primary) hypertension: Secondary | ICD-10-CM

## 2023-01-04 DIAGNOSIS — M545 Low back pain, unspecified: Secondary | ICD-10-CM

## 2023-01-04 DIAGNOSIS — G8929 Other chronic pain: Secondary | ICD-10-CM

## 2023-01-04 MED ORDER — MECLIZINE HCL 25 MG PO TABS: 25.0000 mg | ORAL_TABLET | Freq: Two times a day (BID) | ORAL | 3 refills | Status: DC | PRN

## 2023-01-04 NOTE — Patient Instructions (Signed)
Labs ordered.  Xray at the hospital.  Referral placed to neurology.  Follow up to be determined after xray result.

## 2023-01-06 DIAGNOSIS — G8929 Other chronic pain: Secondary | ICD-10-CM | POA: Insufficient documentation

## 2023-01-06 DIAGNOSIS — F1911 Other psychoactive substance abuse, in remission: Secondary | ICD-10-CM | POA: Insufficient documentation

## 2023-01-06 MED ORDER — HYDROCHLOROTHIAZIDE 25 MG PO TABS: 25.0000 mg | ORAL_TABLET | Freq: Every day | ORAL | 1 refills | Status: DC

## 2023-01-06 MED ORDER — LISINOPRIL 40 MG PO TABS: 40.0000 mg | ORAL_TABLET | Freq: Every day | ORAL | 1 refills | Status: DC

## 2023-01-06 NOTE — Progress Notes (Signed)
Subjective:  Patient ID: Lindsay Mcmillan, female    DOB: 06-11-1971  Age: 52 y.o. MRN: [id number]  CC: Chief Complaint  Patient presents with   Establish Care    Low back pain , butt, left leg pain, knee  Daytime sleepiness, jerking upper extremities uncontrolled    HPI:  52 year old female with hypertension, COPD, history of substance abuse including opioids (currently on methadone) presents to establish care.  Patient has several concerns today.  Patient's BP mildly elevated here today.  She is on HCTZ and lisinopril.  EMR reflects that she has not been compliant.  Requesting refills today.  Recent labs from the ER.  Patient reports that she is having severe low back pain.  Chronic.  Worsening.  Bilateral.  No imaging noted in the chart.  No relief with over-the-counter analgesics.  Patient reports that she is having jerking movements of her upper extremities.  Occurs randomly.  Per the EMR, she has a history of substance abuse including methamphetamines.  This could certainly contribute to her symptoms.  She also notes excessive daytime sleepiness.  She falls asleep very easily and quickly.  She is even falling asleep while using the bathroom on the toilet.  Has never had a sleep study.  Snores.  Social Hx   Social History   Socioeconomic History   Marital status: Single    Spouse name: Not on file   Number of children: Not on file   Years of education: Not on file   Highest education level: Not on file  Occupational History   Not on file  Tobacco Use   Smoking status: Every Day    Packs/day: 2.00    Years: 30.00    Additional pack years: 0.00    Total pack years: 60.00    Types: Cigarettes   Smokeless tobacco: Never  Vaping Use   Vaping Use: Former  Substance and Sexual Activity   Alcohol use: No   Drug use: No   Sexual activity: Yes    Birth control/protection: None  Other Topics Concern   Not on file  Social History Narrative   Lives with boyfriend of  17 years      Enjoys: limited       Diet: eats all food groups    Caffeine: 20 oz of coke    Water: never       Wears seat    Does not use phone while driving    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   Does not Database administrator   Social Determinants of Health   Financial Resource Strain: Low Risk  (04/20/2020)   Overall Financial Resource Strain (CARDIA)    Difficulty of Paying Living Expenses: Not hard at all  Food Insecurity: No Food Insecurity (04/20/2020)   Hunger Vital Sign    Worried About Running Out of Food in the Last Year: Never true    Ran Out of Food in the Last Year: Never true  Transportation Needs: No Transportation Needs (04/20/2020)   PRAPARE - Administrator, Civil Service (Medical): No    Lack of Transportation (Non-Medical): No  Physical Activity: Sufficiently Active (04/20/2020)   Exercise Vital Sign    Days of Exercise per Week: 4 days    Minutes of Exercise per Session: 60 min  Stress: Stress Concern Present (04/20/2020)   [employer] of Occupational Health - Occupational Stress Questionnaire    Feeling of Stress : Very much  Social Connections: Moderately  Integrated (04/20/2020)   Social Connection and Isolation Panel [NHANES]    Frequency of Communication with Friends and Family: More than three times a week    Frequency of Social Gatherings with Friends and Family: Once a week    Attends Religious Services: More than 4 times per year    Active Member of [employer] or Organizations: No    Attends Engineer, structural: Never    Marital Status: Living with partner    Review of Systems Per HPI  Objective:  BP (!) 144/80   Pulse 70   Temp (!) 97.5 F (36.4 C)   Ht 5\' 8"  (1.727 m)   Wt 195 lb (88.5 kg)   SpO2 93%   BMI 29.65 kg/m      01/04/2023    3:46 PM 01/04/2023    3:23 PM 09/14/2022    3:00 AM  BP/Weight  Systolic BP 144 166 172  Diastolic BP 80 73 91  Wt. (Lbs)  195   BMI  29.65 kg/m2     Physical  Exam Vitals and nursing note reviewed.  Constitutional:      General: She is not in acute distress.    Appearance: Normal appearance.  HENT:     Head: Normocephalic and atraumatic.  Eyes:     General:        Right eye: No discharge.        Left eye: No discharge.     Conjunctiva/sclera: Conjunctivae normal.  Cardiovascular:     Rate and Rhythm: Normal rate and regular rhythm.  Pulmonary:     Effort: Pulmonary effort is normal.     Breath sounds: Normal breath sounds. No wheezing or rales.  Neurological:     Mental Status: She is alert.     Lab Results  Component Value Date   WBC 9.5 09/14/2022   HGB 13.5 09/14/2022   HCT 43.1 09/14/2022   PLT 263 09/14/2022   GLUCOSE 102 (H) 09/14/2022   CHOL 158 05/16/2020   TRIG 76 05/16/2020   HDL 50 05/16/2020   LDLCALC 93 05/16/2020   ALT 28 03/07/2022   AST 17 03/07/2022   NA 141 09/14/2022   K 3.7 09/14/2022   CL 105 09/14/2022   CREATININE 0.90 09/14/2022   BUN 15 09/14/2022   CO2 28 09/14/2022     Assessment & Plan:   Problem List Items Addressed This Visit       Cardiovascular and Mediastinum   Essential hypertension    Uncontrolled.  Likely due to noncompliance.  Medications refilled.      Relevant Medications   furosemide (LASIX) 20 MG tablet   hydrochlorothiazide (HYDRODIURIL) 25 MG tablet   lisinopril (ZESTRIL) 40 MG tablet     Other   RESOLVED: Dizziness   Relevant Medications   meclizine (ANTIVERT) 25 MG tablet   Chronic bilateral low back pain - Primary    X-ray for further evaluation.      Relevant Medications   METHADONE HCL PO   Other Relevant Orders   DG Lumbar Spine Complete   Excessive daytime sleepiness    Needs a sleep study.  Referring to neurology.      Relevant Orders   Ambulatory referral to Neurology   TSH   History of substance abuse (HCC)   Jerking movements of extremities    Referring to neurology.      Relevant Orders   Ambulatory referral to Neurology   Other  Visit Diagnoses  Screening, lipid       Relevant Orders   Lipid panel       Meds ordered this encounter  Medications   meclizine (ANTIVERT) 25 MG tablet    Sig: Take 1 tablet (25 mg total) by mouth 2 (two) times daily as needed for dizziness.    Dispense:  30 tablet    Refill:  3   hydrochlorothiazide (HYDRODIURIL) 25 MG tablet    Sig: Take 1 tablet (25 mg total) by mouth daily.    Dispense:  90 tablet    Refill:  1   lisinopril (ZESTRIL) 40 MG tablet    Sig: Take 1 tablet (40 mg total) by mouth daily.    Dispense:  90 tablet    Refill:  1    Follow-up:  TBD after xray results  Everlene Other DO [facility]

## 2023-01-06 NOTE — Assessment & Plan Note (Signed)
Uncontrolled.  Likely due to noncompliance.  Medications refilled.

## 2023-01-06 NOTE — Assessment & Plan Note (Signed)
X-ray for further evaluation. 

## 2023-01-06 NOTE — Assessment & Plan Note (Signed)
Needs a sleep study.  Referring to neurology.

## 2023-01-06 NOTE — Assessment & Plan Note (Signed)
Referring to neurology. 

## 2023-01-16 ENCOUNTER — Ambulatory Visit (HOSPITAL_COMMUNITY)
Admission: RE | Admit: 2023-01-16 | Discharge: 2023-01-16 | Disposition: A | Discharge status: Home / Self Care | Payer: Medicaid Other | Source: Ambulatory Visit | Attending: Family Medicine | Admitting: Family Medicine

## 2023-01-16 DIAGNOSIS — G8929 Other chronic pain: Secondary | ICD-10-CM | POA: Insufficient documentation

## 2023-01-16 DIAGNOSIS — M545 Low back pain, unspecified: Secondary | ICD-10-CM | POA: Insufficient documentation

## 2023-01-22 ENCOUNTER — Other Ambulatory Visit: Payer: Self-pay

## 2023-01-22 ENCOUNTER — Other Ambulatory Visit: Payer: Self-pay | Admitting: Family Medicine

## 2023-01-22 DIAGNOSIS — M545 Low back pain, unspecified: Secondary | ICD-10-CM

## 2023-01-22 MED ORDER — MELOXICAM 15 MG PO TABS: 15.0000 mg | ORAL_TABLET | Freq: Every day | ORAL | 0 refills | Status: DC | PRN

## 2023-01-23 NOTE — Progress Notes (Signed)
Patient has been notified of prescription.

## 2023-02-04 ENCOUNTER — Other Ambulatory Visit: Payer: Self-pay

## 2023-02-04 ENCOUNTER — Emergency Department (HOSPITAL_COMMUNITY): Payer: Medicaid Other

## 2023-02-04 ENCOUNTER — Encounter (HOSPITAL_COMMUNITY): Payer: Self-pay

## 2023-02-04 ENCOUNTER — Emergency Department (HOSPITAL_COMMUNITY)
Admission: EM | Admit: 2023-02-04 | Discharge: 2023-02-04 | Disposition: A | Discharge status: Home / Self Care | Payer: Medicaid Other | Attending: Emergency Medicine | Admitting: Emergency Medicine

## 2023-02-04 DIAGNOSIS — D72829 Elevated white blood cell count, unspecified: Secondary | ICD-10-CM | POA: Insufficient documentation

## 2023-02-04 DIAGNOSIS — Z79899 Other long term (current) drug therapy: Secondary | ICD-10-CM | POA: Diagnosis not present

## 2023-02-04 DIAGNOSIS — W19XXXA Unspecified fall, initial encounter: Secondary | ICD-10-CM | POA: Diagnosis not present

## 2023-02-04 DIAGNOSIS — I10 Essential (primary) hypertension: Secondary | ICD-10-CM | POA: Diagnosis not present

## 2023-02-04 DIAGNOSIS — J449 Chronic obstructive pulmonary disease, unspecified: Secondary | ICD-10-CM | POA: Insufficient documentation

## 2023-02-04 DIAGNOSIS — G479 Sleep disorder, unspecified: Secondary | ICD-10-CM | POA: Insufficient documentation

## 2023-02-04 DIAGNOSIS — S6992XA Unspecified injury of left wrist, hand and finger(s), initial encounter: Secondary | ICD-10-CM | POA: Diagnosis present

## 2023-02-04 DIAGNOSIS — S62102A Fracture of unspecified carpal bone, left wrist, initial encounter for closed fracture: Secondary | ICD-10-CM

## 2023-02-04 DIAGNOSIS — S52592A Other fractures of lower end of left radius, initial encounter for closed fracture: Secondary | ICD-10-CM | POA: Diagnosis not present

## 2023-02-04 LAB — CBC WITH DIFFERENTIAL/PLATELET
Abs Immature Granulocytes: 0.04 10*3/uL (ref 0.00–0.07)
Basophils Absolute: 0.1 10*3/uL (ref 0.0–0.1)
Basophils Relative: 1 %
Eosinophils Absolute: 0.3 10*3/uL (ref 0.0–0.5)
Eosinophils Relative: 2 %
HCT: 39.3 % (ref 36.0–46.0)
Hemoglobin: 12.1 g/dL (ref 12.0–15.0)
Immature Granulocytes: 0 %
Lymphocytes Relative: 16 %
Lymphs Abs: 2.1 10*3/uL (ref 0.7–4.0)
MCH: 26.7 pg (ref 26.0–34.0)
MCHC: 30.8 g/dL (ref 30.0–36.0)
MCV: 86.6 fL (ref 80.0–100.0)
Monocytes Absolute: 1.2 10*3/uL — ABNORMAL HIGH (ref 0.1–1.0)
Monocytes Relative: 9 %
Neutro Abs: 9.4 10*3/uL — ABNORMAL HIGH (ref 1.7–7.7)
Neutrophils Relative %: 72 %
Platelets: 193 10*3/uL (ref 150–400)
RBC: 4.54 MIL/uL (ref 3.87–5.11)
RDW: 15.2 % (ref 11.5–15.5)
WBC: 13.1 10*3/uL — ABNORMAL HIGH (ref 4.0–10.5)
nRBC: 0 % (ref 0.0–0.2)

## 2023-02-04 LAB — MAGNESIUM: Magnesium: 2.1 mg/dL (ref 1.7–2.4)

## 2023-02-04 LAB — BASIC METABOLIC PANEL
Anion gap: 4 — ABNORMAL LOW (ref 5–15)
BUN: 17 mg/dL (ref 6–20)
CO2: 26 mmol/L (ref 22–32)
Calcium: 8.2 mg/dL — ABNORMAL LOW (ref 8.9–10.3)
Chloride: 106 mmol/L (ref 98–111)
Creatinine, Ser: 0.97 mg/dL (ref 0.44–1.00)
GFR, Estimated: >60 mL/min (ref >60)
Glucose, Bld: 98 mg/dL (ref 70–99)
Potassium: 3.8 mmol/L (ref 3.5–5.1)
Sodium: 136 mmol/L (ref 135–145)

## 2023-02-04 MED ORDER — ACETAMINOPHEN 325 MG PO TABS
650.0000 mg | ORAL_TABLET | Freq: Once | ORAL | Status: AC
  Administered 2023-02-04: 650 mg via ORAL
  Filled 2023-02-04: qty 2

## 2023-02-04 NOTE — Discharge Instructions (Signed)
Your x-ray shows that you have a broken bone in your wrist.  A splint has been applied today.  Elevate your hand when possible.  Keep it splinted.  You may wear the sling as needed for comfort and support.  As discussed, please follow-up with your primary care provider regarding your sleep disorder as you will likely need a sleep study.  No driving or operating machinery until you have been properly evaluated by your primary care provider and cleared to do so

## 2023-02-04 NOTE — ED Provider Notes (Signed)
[place] EMERGENCY DEPARTMENT AT [facility] Provider Note   CSN: [id number] Arrival date & time: 02/04/23  1257     History {Add pertinent medical, surgical, social history, OB history to HPI:1} Chief Complaint  Patient presents with   Wrist Pain    Lindsay Mcmillan is a 52 y.o. female.   Wrist Pain       Home Medications Prior to Admission medications   Medication Sig Start Date End Date Taking? Authorizing Provider  acetaminophen (TYLENOL) 500 MG tablet Take 1,000 mg by mouth every 6 (six) hours as needed for mild pain or headache.    [provider]  EPINEPHrine 0.3 mg/0.3 mL IJ SOAJ injection Inject 0.3 mg into the muscle as needed for anaphylaxis. 03/07/22   Kommor, [place], MD  furosemide (LASIX) 20 MG tablet Take 20 mg by mouth as needed. 08/15/22   [provider]  hydrochlorothiazide (HYDRODIURIL) 25 MG tablet Take 1 tablet (25 mg total) by mouth daily. 01/06/23   Tommie Sams, DO  lisinopril (ZESTRIL) 40 MG tablet Take 1 tablet (40 mg total) by mouth daily. 01/06/23   Tommie Sams, DO  loratadine (CLARITIN) 10 MG tablet Take 1 tablet by mouth daily. 06/08/22   [provider]  meclizine (ANTIVERT) 25 MG tablet Take 1 tablet (25 mg total) by mouth 2 (two) times daily as needed for dizziness. 01/04/23   Tommie Sams, DO  meloxicam (MOBIC) 15 MG tablet Take 1 tablet (15 mg total) by mouth daily as needed for pain. 01/22/23   Cook, Dorie Rank G, DO  METHADONE HCL PO Take by mouth.    [provider]  omeprazole (PRILOSEC) 40 MG capsule Take 1 capsule by mouth daily. 09/14/22   [provider]  ondansetron (ZOFRAN-ODT) 4 MG disintegrating tablet Take 1 tablet (4 mg total) by mouth every 8 (eight) hours as needed for nausea or vomiting. 03/07/22   Kommor, Wyn Forster, MD  TRELEGY ELLIPTA 100-62.5-25 MCG/INH AEPB INHALE 1 DOSE INTO THE LUNGS DAILY BEFORE BREAKFAST. 11/28/20   Heather Roberts, NP      Allergies    Dalbavancin and  Vancomycin    Review of Systems   Review of Systems  Physical Exam Updated Vital Signs BP (!) 174/84 (BP Location: Right Arm)   Pulse (!) 58   Resp 16   Ht 5\' 8"  (1.727 m)   Wt 89.4 kg   SpO2 90%   BMI 29.95 kg/m  Physical Exam  ED Results / Procedures / Treatments   Labs (all labs ordered are listed, but only abnormal results are displayed) Labs Reviewed  CBC WITH DIFFERENTIAL/PLATELET - Abnormal; Notable for the following components:      Result Value   WBC 13.1 (*)    Neutro Abs 9.4 (*)    Monocytes Absolute 1.2 (*)    All other components within normal limits  BASIC METABOLIC PANEL - Abnormal; Notable for the following components:   Calcium 8.2 (*)    Anion gap 4 (*)    All other components within normal limits  MAGNESIUM    EKG None  Radiology DG Wrist Complete Left  Result Date: 02/04/2023 CLINICAL DATA:  injury EXAM: LEFT WRIST - COMPLETE 3+ VIEW; LEFT HAND - COMPLETE 3+ VIEW COMPARISON:  None Available. FINDINGS: There is subtle fracture along the cortex of the dorsal aspect of the distal left radial metaphysis. No other acute fracture or dislocation. No aggressive osseous lesion. There are mild degenerative changes of imaged  joints. No radiopaque foreign bodies. Soft tissues are within normal limits. IMPRESSION: *Subtle fracture along the dorsal aspect of the distal left radial metaphysis. Electronically Signed   By: Jules Schick M.D.   On: 02/04/2023 15:09   DG Hand Complete Left  Result Date: 02/04/2023 CLINICAL DATA:  injury EXAM: LEFT WRIST - COMPLETE 3+ VIEW; LEFT HAND - COMPLETE 3+ VIEW COMPARISON:  None Available. FINDINGS: There is subtle fracture along the cortex of the dorsal aspect of the distal left radial metaphysis. No other acute fracture or dislocation. No aggressive osseous lesion. There are mild degenerative changes of imaged joints. No radiopaque foreign bodies. Soft tissues are within normal limits. IMPRESSION: *Subtle fracture along the  dorsal aspect of the distal left radial metaphysis. Electronically Signed   By: Jules Schick M.D.   On: 02/04/2023 15:09    Procedures Procedures  {Document cardiac monitor, telemetry assessment procedure when appropriate:1}  Medications Ordered in ED Medications - No data to display  ED Course/ Medical Decision Making/ A&P   {   Click here for ABCD2, HEART and other calculatorsREFRESH Note before signing :1}                          Medical Decision Making Patient here with left wrist pain after mechanical fall.  States she is being having chronic, reoccurring episodes where she spontaneously falls asleep.  Symptoms have been present for nearly 1 year.  States she has fallen asleep while driving.  Has recently established PCP and is working with PCP to evaluate this.  Concerned that she has narcolepsy.  Requested to be tested for narcolepsy.   Mechanical fall, wrist pain, neurovascularly intact.  Pain with range of motion of the wrist.  No snuffbox tenderness on exam.  Endorses a episode where she fell asleep, stumbled and fell backwards landing on outstretched hand.  Denies head injury  Amount and/or Complexity of Data Reviewed Labs: ordered.    Details: Labs show mild leukocytosis with white count of 13,000, otherwise unremarkable Radiology: ordered.    Details: X-ray of the left hand and left wrist shows subtle fracture along the dorsal aspect of the distal left radial metaphysis ECG/medicine tests: ordered.    Details: EKG shows sinus rhythm Discussion of management or test interpretation with external provider(s): Baseline labs here without concerning symptoms for electrolyte abnormality.  No indication of infection.  Wrist fracture discussed with patient.  Sugar-tong splint and sling applied.  Will follow-up with orthopedics.  She will follow-up with her PCP regarding her possible narcolepsy.  Patient will likely need sleep study.     {Document critical care time when  appropriate:1} {Document review of labs and clinical decision tools ie heart score, Chads2Vasc2 etc:1}  {Document your independent review of radiology images, and any outside records:1} {Document your discussion with family members, caretakers, and with consultants:1} {Document social determinants of health affecting pt's care:1} {Document your decision making why or why not admission, treatments were needed:1} Final Clinical Impression(s) / ED Diagnoses Final diagnoses:  None    Rx / DC Orders ED Discharge Orders     None

## 2023-02-04 NOTE — ED Triage Notes (Signed)
Pt reports she she fell and injured her left wrist and hand 2 days ago.  Pt reports she wants to be tested for narcolepsy as she has been having problems randomly falling asleep, even while driving.  Pt reports she has started to see a PCP but has not addressed this with them yet.

## 2023-02-05 ENCOUNTER — Telehealth: Payer: Self-pay

## 2023-02-05 NOTE — Transitions of Care (Post Inpatient/ED Visit) (Signed)
02/05/2023  Name: Lindsay Mcmillan MRN: [id number] DOB: 01-08-1971  Today's TOC FU Call Status: Today's TOC FU Call Status:: Successful TOC FU Call Competed TOC FU Call Complete Date: 02/05/23  Transition Care Management Follow-up Telephone Call Date of Discharge: 02/04/23 [facility] Type of Discharge: Emergency Department Reason for ED Visit: Other: (left wrist fracture) How have you been since you were released from the hospital?: Same Any questions or concerns?: No  Items Reviewed: Did you receive and understand the discharge instructions provided?: Yes Medications obtained,verified, and reconciled?: Yes (Medications Reviewed) Any new allergies since your discharge?: No Dietary orders reviewed?: Yes Do you have support at home?: No  Medications Reviewed Today: Medications Reviewed Today     Reviewed by Karena Addison, LPN (Licensed Practical Nurse) on 02/05/23 at 1132  Med List Status: <None>   Medication Order Taking? Sig Documenting Provider Last Dose Status Informant  acetaminophen (TYLENOL) 500 MG tablet [id number] No Take 1,000 mg by mouth every 6 (six) hours as needed for mild pain or headache. [provider] Taking Active Self  EPINEPHrine 0.3 mg/0.3 mL IJ SOAJ injection [id number] No Inject 0.3 mg into the muscle as needed for anaphylaxis. Kommor, Madison, MD Taking Active   furosemide (LASIX) 20 MG tablet [id number] No Take 20 mg by mouth as needed. [provider] Taking Active   hydrochlorothiazide (HYDRODIURIL) 25 MG tablet [id number]  Take 1 tablet (25 mg total) by mouth daily. Everlene Other G, DO  Active   lisinopril (ZESTRIL) 40 MG tablet [id number]  Take 1 tablet (40 mg total) by mouth daily. Everlene Other G, DO  Active   loratadine (CLARITIN) 10 MG tablet [id number] No Take 1 tablet by mouth daily. [provider] Taking Active   meclizine (ANTIVERT) 25 MG tablet [id number]  Take 1 tablet (25 mg total) by mouth 2  (two) times daily as needed for dizziness. Everlene Other G, DO  Active   meloxicam (MOBIC) 15 MG tablet [id number]  Take 1 tablet (15 mg total) by mouth daily as needed for pain. Tommie Sams, DO  Active   METHADONE HCL PO [id number] No Take by mouth. [provider] Taking Active   omeprazole (PRILOSEC) 40 MG capsule [id number] No Take 1 capsule by mouth daily. [provider] Taking Active   ondansetron (ZOFRAN-ODT) 4 MG disintegrating tablet [id number] No Take 1 tablet (4 mg total) by mouth every 8 (eight) hours as needed for nausea or vomiting. Kommor, Madison, MD Taking Active   TRELEGY ELLIPTA 100-62.5-25 MCG/INH AEPB [id number] No INHALE 1 DOSE INTO THE LUNGS DAILY BEFORE BREAKFAST. Heather Roberts, NP Taking Active             Home Care and Equipment/Supplies: Were Home Health Services Ordered?: NA Any new equipment or medical supplies ordered?: NA  Functional Questionnaire: Do you need assistance with bathing/showering or dressing?: No Do you need assistance with meal preparation?: No Do you need assistance with eating?: No Do you have difficulty maintaining continence: No Do you need assistance with getting out of bed/getting out of a chair/moving?: No Do you have difficulty managing or taking your medications?: No  Follow up appointments reviewed: PCP Follow-up appointment confirmed?: Yes Date of PCP follow-up appointment?: 02/06/23 Follow-up Provider: [facility] Follow-up appointment confirmed?: No Reason Specialist Follow-Up Not Confirmed: Patient has Specialist Provider Number and will Call for Appointment Do you need transportation to your follow-up appointment?: No Do you understand care options if your condition(s) worsen?: Yes-patient  verbalized understanding    SIGNATURE Karena Addison, LPN [facility] Nurse Health Advisor Direct Dial [phone]

## 2023-02-06 ENCOUNTER — Inpatient Hospital Stay: Payer: Medicaid Other | Admitting: Family Medicine

## 2023-02-12 ENCOUNTER — Encounter: Payer: Self-pay | Admitting: Orthopaedic Surgery

## 2023-02-12 ENCOUNTER — Ambulatory Visit: Payer: Medicaid Other | Admitting: Orthopaedic Surgery

## 2023-02-12 VITALS — BP 165/99 | HR 75 | Ht 68.0 in | Wt 196.2 lb

## 2023-02-12 DIAGNOSIS — S52522A Torus fracture of lower end of left radius, initial encounter for closed fracture: Secondary | ICD-10-CM | POA: Diagnosis not present

## 2023-02-12 NOTE — Progress Notes (Signed)
Subjective:    Patient ID: Lindsay Mcmillan, female    DOB: [birth year], 52 y.o.   MRN: [id number]  HPI She fell at home on July 27 and hurt her left wrist.  She went to the ER on 01-26-23.  X-rays showed: IMPRESSION: *Subtle fracture along the dorsal aspect of the distal left radial metaphysis.  She was placed in a sugar tong splint.  She has removed it.  She has ace bandage in place.  She has no other injury.  Pain is controlled.   Review of Systems  Constitutional:  Positive for activity change.  Musculoskeletal:  Positive for arthralgias, joint swelling and myalgias.  All other systems reviewed and are negative. For Review of Systems, all other systems reviewed and are negative.  The following is a summary of the past history medically, past history surgically, known current medicines, social history and family history.  This information is gathered electronically by the computer from prior information and documentation.  I review this each visit and have found including this information at this point in the chart is beneficial and informative.   Past Medical History:  Diagnosis Date   BOILS, RECURRENT 10/10/2009   Qualifier: Diagnosis of  By: De Blanch.    Chest pain in adult 09/13/2019   COPD (chronic obstructive pulmonary disease) (HCC)    Encounter for screening mammogram for malignant neoplasm of breast 08/11/2019   Inguinal pain, right 12/09/2020   Substance abuse (HCC)    Tobacco dependence    Wound dehiscence     Past Surgical History:  Procedure Laterality Date   ABDOMINAL HYSTERECTOMY     CESAREAN SECTION      Current Outpatient Medications on File Prior to Visit  Medication Sig Dispense Refill   acetaminophen (TYLENOL) 500 MG tablet Take 1,000 mg by mouth every 6 (six) hours as needed for mild pain or headache.     EPINEPHrine 0.3 mg/0.3 mL IJ SOAJ injection Inject 0.3 mg into the muscle as needed for anaphylaxis. 1 each 0   furosemide (LASIX) 20  MG tablet Take 20 mg by mouth as needed.     hydrochlorothiazide (HYDRODIURIL) 25 MG tablet Take 1 tablet (25 mg total) by mouth daily. 90 tablet 1   lisinopril (ZESTRIL) 40 MG tablet Take 1 tablet (40 mg total) by mouth daily. 90 tablet 1   loratadine (CLARITIN) 10 MG tablet Take 1 tablet by mouth daily.     meclizine (ANTIVERT) 25 MG tablet Take 1 tablet (25 mg total) by mouth 2 (two) times daily as needed for dizziness. 30 tablet 3   meloxicam (MOBIC) 15 MG tablet Take 1 tablet (15 mg total) by mouth daily as needed for pain. 30 tablet 0   METHADONE HCL PO Take by mouth.     omeprazole (PRILOSEC) 40 MG capsule Take 1 capsule by mouth daily.     ondansetron (ZOFRAN-ODT) 4 MG disintegrating tablet Take 1 tablet (4 mg total) by mouth every 8 (eight) hours as needed for nausea or vomiting. 20 tablet 0   TRELEGY ELLIPTA 100-62.5-25 MCG/INH AEPB INHALE 1 DOSE INTO THE LUNGS DAILY BEFORE BREAKFAST. 60 each 1   No current facility-administered medications on file prior to visit.    Social History   Socioeconomic History   Marital status: Single    Spouse name: Not on file   Number of children: Not on file   Years of education: Not on file   Highest education level: Not on file  Occupational History   Not on file  Tobacco Use   Smoking status: Every Day    Current packs/day: 2.00    Average packs/day: 2.0 packs/day for 30.0 years (60.0 ttl pk-yrs)    Types: Cigarettes   Smokeless tobacco: Never  Vaping Use   Vaping status: Former  Substance and Sexual Activity   Alcohol use: No   Drug use: Yes    Types: Cocaine    Comment: fentanyl   Sexual activity: Yes    Birth control/protection: None  Other Topics Concern   Not on file  Social History Narrative   Lives with boyfriend of 17 years      Enjoys: limited       Diet: eats all food groups    Caffeine: 20 oz of coke    Water: never       Wears seat    Does not use phone while driving    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   Does not  Database administrator   Social Determinants of Health   Financial Resource Strain: Low Risk  (04/20/2020)   Overall Financial Resource Strain (CARDIA)    Difficulty of Paying Living Expenses: Not hard at all  Food Insecurity: Low Risk  (07/06/2022)   [facility]   Food vital sign    Within the past 12 months, you worried that your food would run out before you got money to buy more: Never true    Within the past 12 months, the food you bought just didn't last and you didn't have money to get more: Not on file  Transportation Needs: Not on file (07/06/2022)  Physical Activity: Sufficiently Active (04/20/2020)   Exercise Vital Sign    Days of Exercise per Week: 4 days    Minutes of Exercise per Session: 60 min  Stress: Stress Concern Present (04/20/2020)   [employer] of Occupational Health - Occupational Stress Questionnaire    Feeling of Stress : Very much  Social Connections: Moderately Integrated (04/20/2020)   Social Connection and Isolation Panel [NHANES]    Frequency of Communication with Friends and Family: More than three times a week    Frequency of Social Gatherings with Friends and Family: Once a week    Attends Religious Services: More than 4 times per year    Active Member of [employer] or Organizations: No    Attends Banker Meetings: Never    Marital Status: Living with partner  Intimate Partner Violence: Not At Risk (04/20/2020)   Humiliation, Afraid, Rape, and Kick questionnaire    Fear of Current or Ex-Partner: No    Emotionally Abused: No    Physically Abused: No    Sexually Abused: No    Family History  Problem Relation Age of Onset   Diabetes Other    Cancer Other     BP (!) 165/99 Comment: hsa not had medication this morning  Pulse 75   Ht 5\' 8"  (1.727 m)   Wt 196 lb 3.2 oz (89 kg)   BMI 29.83 kg/m   Body mass index is 29.83 kg/m.      Objective:   Physical Exam Vitals and nursing  note reviewed. Exam conducted with a chaperone present.  Constitutional:      Appearance: She is well-developed.  HENT:     Head: Normocephalic and atraumatic.  Eyes:     Conjunctiva/sclera: Conjunctivae normal.     Pupils: Pupils are equal, round, and reactive  to light.  Cardiovascular:     Rate and Rhythm: Normal rate and regular rhythm.  Pulmonary:     Effort: Pulmonary effort is normal.  Abdominal:     Palpations: Abdomen is soft.  Musculoskeletal:       Arms:     Cervical back: Normal range of motion and neck supple.  Skin:    General: Skin is warm and dry.  Neurological:     Mental Status: She is alert and oriented to person, place, and time.     Cranial Nerves: No cranial nerve deficit.     Motor: No abnormal muscle tone.     Coordination: Coordination normal.     Deep Tendon Reflexes: Reflexes are normal and symmetric. Reflexes normal.  Psychiatric:        Behavior: Behavior normal.        Thought Content: Thought content normal.        Judgment: Judgment normal.   I have independently reviewed and interpreted x-rays of this patient done at another site by another physician or qualified health professional. I have reviewed the ER notes.        Assessment & Plan:   Encounter Diagnosis  Name Primary?   Traumatic closed nondisplaced metaphyseal torus fracture of distal radius, left, initial encounter Yes   She is placed in a long arm fiberglass cast.  Return in three weeks.  X-rays out of cast then.  Consider splint then.  Call if any problem.  Precautions discussed.  Electronically Signed Darreld Mclean, MD 8/6/20248:40 AM

## 2023-02-14 ENCOUNTER — Inpatient Hospital Stay: Payer: Medicaid Other | Admitting: Family Medicine

## 2023-02-20 ENCOUNTER — Ambulatory Visit (INDEPENDENT_AMBULATORY_CARE_PROVIDER_SITE_OTHER): Payer: Medicaid Other | Admitting: Family Medicine

## 2023-02-20 DIAGNOSIS — I10 Essential (primary) hypertension: Secondary | ICD-10-CM | POA: Diagnosis not present

## 2023-02-20 DIAGNOSIS — G4719 Other hypersomnia: Secondary | ICD-10-CM | POA: Diagnosis not present

## 2023-02-20 DIAGNOSIS — J449 Chronic obstructive pulmonary disease, unspecified: Secondary | ICD-10-CM

## 2023-02-20 NOTE — Patient Instructions (Signed)
Call you for neurology.  The referral is in and they have attempted to call you.  Phone number:  [phone]  Referral placed to Pulmonology (Lung doctor).  Take care  Dr. Adriana Simas

## 2023-02-21 NOTE — Assessment & Plan Note (Signed)
Patient given information on who to contact regarding the referral.  They have reached out to her but were unable to reach her.  Needs sleep study.

## 2023-02-21 NOTE — Assessment & Plan Note (Signed)
Referring to pulmonology. 

## 2023-02-21 NOTE — Progress Notes (Signed)
Subjective:  Patient ID: Lindsay Mcmillan, female    DOB: Apr 13, 1971  Age: 52 y.o. MRN: [id number]  CC:  Follow up   HPI:  52 year old female presents for follow-up for recent fall and subsequent fracture.  Patient recently suffered a fall and suffered a fracture to the left wrist.  She was seen in the ER.  She has been seen by orthopedics and follow-up.  She currently has a cast on.  Patient continues to have difficulty with daytime somnolence and falling asleep easily.  Concern for underlying sleep disorder.  I have already placed a referral to neurology.  They have attempted to contact the patient but were not able to reach her.  I have given her the number so that she can contact them directly.  Patient feels like she is not getting enough oxygen.  She states that she feels the need to take frequent deep breaths.  Patient has underlying COPD and is requesting to see a pulmonologist.  Currently she is satting at 96% on room air.  Patient Active Problem List   Diagnosis Date Noted   History of substance abuse (HCC) 01/06/2023   Chronic bilateral low back pain 01/06/2023   Excessive daytime sleepiness 01/04/2023   Jerking movements of extremities 01/04/2023   Essential hypertension 04/20/2020   Depression 04/20/2020   Chronic obstructive pulmonary disease (HCC) 08/11/2019    Social Hx   Social History   Socioeconomic History   Marital status: Single    Spouse name: Not on file   Number of children: Not on file   Years of education: Not on file   Highest education level: Not on file  Occupational History   Not on file  Tobacco Use   Smoking status: Every Day    Current packs/day: 2.00    Average packs/day: 2.0 packs/day for 30.0 years (60.0 ttl pk-yrs)    Types: Cigarettes   Smokeless tobacco: Never  Vaping Use   Vaping status: Former  Substance and Sexual Activity   Alcohol use: No   Drug use: Yes    Types: Cocaine    Comment: fentanyl   Sexual activity: Yes     Birth control/protection: None  Other Topics Concern   Not on file  Social History Narrative   Lives with boyfriend of 17 years      Enjoys: limited       Diet: eats all food groups    Caffeine: 20 oz of coke    Water: never       Wears seat    Does not use phone while driving    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   Does not Database administrator   Social Determinants of Health   Financial Resource Strain: Low Risk  (04/20/2020)   Overall Financial Resource Strain (CARDIA)    Difficulty of Paying Living Expenses: Not hard at all  Food Insecurity: Low Risk  (07/06/2022)   [facility]   Food vital sign    Within the past 12 months, you worried that your food would run out before you got money to buy more: Never true    Within the past 12 months, the food you bought just didn't last and you didn't have money to get more: Not on file  Transportation Needs: Not on file (07/06/2022)  Physical Activity: Sufficiently Active (04/20/2020)   Exercise Vital Sign    Days of Exercise per Week: 4 days    Minutes of  Exercise per Session: 60 min  Stress: Stress Concern Present (04/20/2020)   [employer] of Occupational Health - Occupational Stress Questionnaire    Feeling of Stress : Very much  Social Connections: Moderately Integrated (04/20/2020)   Social Connection and Isolation Panel [NHANES]    Frequency of Communication with Friends and Family: More than three times a week    Frequency of Social Gatherings with Friends and Family: Once a week    Attends Religious Services: More than 4 times per year    Active Member of [employer] or Organizations: No    Attends Engineer, structural: Never    Marital Status: Living with partner    Review of Systems Per HPI  Objective:  BP (!) 164/98   Pulse (!) 57   Temp (!) 97.2 F (36.2 C)   Ht 5\' 8"  (1.727 m)   Wt 196 lb 9.6 oz (89.2 kg)   SpO2 96%   BMI 29.89 kg/m      02/20/2023   10:26  AM 02/12/2023    8:12 AM 02/04/2023    7:45 PM  BP/Weight  Systolic BP 164 165 170  Diastolic BP 98 99 93  Wt. (Lbs) 196.6 196.2   BMI 29.89 kg/m2 29.83 kg/m2     Physical Exam Vitals reviewed.  Constitutional:      General: She is not in acute distress.    Comments: Slightly drowsy.  HENT:     Head: Normocephalic and atraumatic.  Eyes:     General:        Right eye: No discharge.        Left eye: No discharge.     Conjunctiva/sclera: Conjunctivae normal.  Cardiovascular:     Rate and Rhythm: Normal rate and regular rhythm.  Pulmonary:     Effort: Pulmonary effort is normal.     Breath sounds: Normal breath sounds. No wheezing, rhonchi or rales.     Lab Results  Component Value Date   WBC 13.1 (H) 02/04/2023   HGB 12.1 02/04/2023   HCT 39.3 02/04/2023   PLT 193 02/04/2023   GLUCOSE 98 02/04/2023   CHOL 158 05/16/2020   TRIG 76 05/16/2020   HDL 50 05/16/2020   LDLCALC 93 05/16/2020   ALT 28 03/07/2022   AST 17 03/07/2022   NA 136 02/04/2023   K 3.8 02/04/2023   CL 106 02/04/2023   CREATININE 0.97 02/04/2023   BUN 17 02/04/2023   CO2 26 02/04/2023     Assessment & Plan:   Problem List Items Addressed This Visit       Cardiovascular and Mediastinum   Essential hypertension    BP uncontrolled here today.  Patient admits that she has not taken her medication.  Advised compliance.        Respiratory   Chronic obstructive pulmonary disease (HCC)    Referring to pulmonology.      Relevant Orders   Ambulatory referral to Pulmonology     Other   Excessive daytime sleepiness    Patient given information on who to contact regarding the referral.  They have reached out to her but were unable to reach her.  Needs sleep study.       Everlene Other DO [facility]

## 2023-02-21 NOTE — Assessment & Plan Note (Signed)
BP uncontrolled here today.  Patient admits that she has not taken her medication.  Advised compliance.

## 2023-03-05 ENCOUNTER — Encounter: Payer: Self-pay | Admitting: Orthopaedic Surgery

## 2023-03-05 ENCOUNTER — Ambulatory Visit: Payer: Medicaid Other | Admitting: Orthopaedic Surgery

## 2023-03-05 ENCOUNTER — Other Ambulatory Visit (INDEPENDENT_AMBULATORY_CARE_PROVIDER_SITE_OTHER): Payer: Medicaid Other

## 2023-03-05 DIAGNOSIS — S52522A Torus fracture of lower end of left radius, initial encounter for closed fracture: Secondary | ICD-10-CM | POA: Diagnosis not present

## 2023-03-05 DIAGNOSIS — S52522D Torus fracture of lower end of left radius, subsequent encounter for fracture with routine healing: Secondary | ICD-10-CM

## 2023-03-05 NOTE — Progress Notes (Signed)
My wrist is better.  She has had the cast removed from the left arm.  NV intact. ROM is good first time out of the cast.  X-rays were done of the left wrist, reported separately.  Encounter Diagnosis  Name Primary?   Traumatic closed nondisplaced metaphyseal torus fracture of distal radius with routine healing, left Yes   I will place in cock-up splint.  Return in two weeks.  X-rays then.  Call if any problem.  Precautions discussed.  Electronically Signed Darreld Mclean, MD 8/27/20248:52 AM

## 2023-03-13 ENCOUNTER — Encounter: Payer: Medicaid Other | Admitting: Orthopaedic Surgery

## 2023-03-19 ENCOUNTER — Other Ambulatory Visit (INDEPENDENT_AMBULATORY_CARE_PROVIDER_SITE_OTHER): Payer: Medicaid Other

## 2023-03-19 ENCOUNTER — Ambulatory Visit (INDEPENDENT_AMBULATORY_CARE_PROVIDER_SITE_OTHER): Payer: Medicaid Other | Admitting: Orthopaedic Surgery

## 2023-03-19 ENCOUNTER — Encounter: Payer: Self-pay | Admitting: Orthopaedic Surgery

## 2023-03-19 VITALS — BP 170/101 | HR 75

## 2023-03-19 DIAGNOSIS — S52522D Torus fracture of lower end of left radius, subsequent encounter for fracture with routine healing: Secondary | ICD-10-CM

## 2023-03-19 NOTE — Progress Notes (Signed)
My wrist is better.  She has healing fracture of the left distal radius.  X-rays were done today, reported separately.  Encounter Diagnosis  Name Primary?   Traumatic closed nondisplaced metaphyseal torus fracture of distal radius with routine healing, left Yes   She has lower back pain.  I will see her for that next week.  Stop the cock-up splint.  Call if any problem.  Precautions discussed.  Electronically Signed Darreld Mclean, MD 9/10/20249:39 AM

## 2023-03-24 NOTE — Progress Notes (Unsigned)
Lindsay Mcmillan, female    DOB: 05-Apr-1971    MRN: [id number]   Brief patient profile:  49  yowf  active smoker  referred to pulmonary clinic in [place]  03/25/2023 by Everlene Other DO  for freq bronchitis and doe x 2014 and worse since rx trelegy > no better.   GOLD 2 copd criteria 11/17/19   History of Present Illness  03/25/2023  Pulmonary/ 1st office eval/ [facility]an / [place] [facility] on trelegy 100 and lisinopril 40  Chief Complaint  Patient presents with   Establish Care   Shortness of Breath  Dyspnea:  walking x 100 ft no better on trelegy (? Helped at first)  Cough: p sev hours asleep assoc with loud wheezing  Sleep: 2 pillws flat freq cough esp in am > clear  SABA use: last used it 2-3 weeks  02: none  Lung cancer screen: referred today   No obvious day to day or daytime pattern/variability or assoc excess/ purulent sputum or mucus plugs or hemoptysis or cp or chest tightness,  or overt sinus or hb symptoms.   Also denies any obvious fluctuation of symptoms with weather or environmental changes or other aggravating or alleviating factors except as outlined above   No unusual exposure hx or h/o childhood pna/ asthma or knowledge of premature birth.  Current Allergies, Complete Past Medical History, Past Surgical History, Family History, and Social History were reviewed in [employer] record.  ROS  The following are not active complaints unless bolded Hoarseness, sore throat, dysphagia, dental problems, itching, sneezing,  nasal congestion or discharge of excess mucus or purulent secretions, ear ache,   fever, chills, sweats, unintended wt loss or wt gain, classically pleuritic or exertional cp,  orthopnea pnd or arm/hand swelling  or leg swelling, presyncope, palpitations, abdominal pain, anorexia, nausea, vomiting, diarrhea  or change in bowel habits or change in bladder habits, change in stools or change in urine, dysuria, hematuria,  rash, arthralgias,  visual complaints, headache, numbness, weakness or ataxia or problems with walking or coordination,  change in mood or  memory.            Outpatient Medications Prior to Visit  Medication Sig Dispense Refill   acetaminophen (TYLENOL) 500 MG tablet Take 1,000 mg by mouth every 6 (six) hours as needed for mild pain or headache.     albuterol (VENTOLIN HFA) 108 (90 Base) MCG/ACT inhaler Inhale 2 puffs into the lungs every 6 (six) hours as needed for wheezing or shortness of breath.     EPINEPHrine 0.3 mg/0.3 mL IJ SOAJ injection Inject 0.3 mg into the muscle as needed for anaphylaxis. 1 each 0   furosemide (LASIX) 20 MG tablet Take 20 mg by mouth as needed.     hydrochlorothiazide (HYDRODIURIL) 25 MG tablet Take 1 tablet (25 mg total) by mouth daily. 90 tablet 1   lisinopril (ZESTRIL) 40 MG tablet Take 1 tablet (40 mg total) by mouth daily. 90 tablet 1   loratadine (CLARITIN) 10 MG tablet Take 1 tablet by mouth daily.     meclizine (ANTIVERT) 25 MG tablet Take 1 tablet (25 mg total) by mouth 2 (two) times daily as needed for dizziness. 30 tablet 3   meloxicam (MOBIC) 15 MG tablet Take 1 tablet (15 mg total) by mouth daily as needed for pain. 30 tablet 0   omeprazole (PRILOSEC) 40 MG capsule Take 1 capsule by mouth daily.     ondansetron (ZOFRAN-ODT) 4 MG disintegrating tablet  Take 1 tablet (4 mg total) by mouth every 8 (eight) hours as needed for nausea or vomiting. 20 tablet 0   TRELEGY ELLIPTA 100-62.5-25 MCG/INH AEPB INHALE 1 DOSE INTO THE LUNGS DAILY BEFORE BREAKFAST. 60 each 1   Buprenorphine HCl-Naloxone HCl 12-3 MG FILM Take 1 film BID. If needed can split the second film in half so can take 1 film in AM, half film in midday and half film in PM.     METHADONE HCL PO Take by mouth.     naloxone (NARCAN) nasal spray 4 mg/0.1 mL Place into the nose.     No facility-administered medications prior to visit.    Past Medical History:  Diagnosis Date   BOILS, RECURRENT 10/10/2009    Qualifier: Diagnosis of  By: De Blanch.    Chest pain in adult 09/13/2019   COPD (chronic obstructive pulmonary disease) (HCC)    Encounter for screening mammogram for malignant neoplasm of breast 08/11/2019   Inguinal pain, right 12/09/2020   Substance abuse (HCC)    Tobacco dependence    Wound dehiscence       Objective:     BP (!) 162/95   Pulse 73   Ht 5\' 8"  (1.727 m)   Wt 198 lb (89.8 kg)   SpO2 94%   BMI 30.11 kg/m   SpO2: 94 % RA   Amb pleasant wf nad   HEENT : Oropharynx  clear      NECK :  without  apparent JVD/ palpable Nodes/TM    LUNGS: no acc muscle use,  Min barrel  contour chest wall with bilateral  slightly decreased bs s audible wheeze and  without cough on insp or exp maneuvers and min  Hyperresonant  to  percussion bilaterally    CV:  RRR  no s3 or murmur or increase in P2, and no edema   ABD:  mod obese soft and nontender   MS:  Nl gait/ ext warm without deformities Or obvious joint restrictions  calf tenderness, cyanosis or clubbing     SKIN: warm and dry without lesions    NEURO:  alert, approp, nl sensorium with  no motor or cerebellar deficits apparent.            Assessment   COPD GOLD ? CT Chest  11/17/19 Mild centrilobular emphysema  - 03/25/2023  After extensive coaching inhaler device,  effectiveness =    75% (short Ti) try change trelegy to breztri 2bid samples and approp saba - 03/25/2023 d/c acei (see hbp)   GOLD 2 should not result in such severe doe/ cough/ noct wheeze if well controlled.  DDX of  difficult airways management almost all start with A and  include Adherence, Ace Inhibitors, Acid Reflux, Active Sinus Disease, Alpha 1 Antitripsin deficiency, Anxiety masquerading as Airways dz,  ABPA,  Allergy(esp in young), Aspiration (esp in elderly), Adverse effects of meds,  Active smoking or vaping, A bunch of PE's (a small clot burden can't cause this syndrome unless there is already severe underlying pulm or  vascular dz with poor reserve) plus two Bs  = Bronchiectasis and Beta blocker use..and one C= CHF   Adherence is always the initial "prime suspect" and is a multilayered concern that requires a "trust but verify" approach in every patient - starting with knowing how to use medications, especially inhalers, correctly, keeping up with refills and understanding the fundamental difference between maintenance and prns vs those medications only taken for a very short  course and then stopped and not refilled.  - see hfa teaching - return with all meds in hand using a trust but verify approach to confirm accurate Medication  Reconciliation The principal here is that until we are certain that the  patients are doing what we've asked, it makes no sense to ask them to do more.   ACEi adverse effects at the  top of the usual list of suspects and the only way to rule it out is a trial off > see a/p    ? Acid (or non-acid) GERD > always difficult to exclude as up to 75% of pts in some series report no assoc GI/ Heartburn symptoms> rec max (24h)  acid suppression by adding pm pepcid to am omeprazole at least until noct cough gone   Active smoking also near top of the list (see separate a/p)       Essential hypertension D/c acei  03/25/2023 - pseudowheeze   In the best review of chronic cough to date ( NEJM 2016 375 1544-1551) ,  ACEi are now felt to cause cough in up to  20% of pts which is a 4 fold increase from previous reports and does not include the variety of non-specific complaints we see in pulmonary clinic in pts on ACEi but previously attributed to another dx like  Copd/asthma and  include PNDS, throat and chest congestion, "bronchitis", unexplained dyspnea and noct "strangling" sensations, and hoarseness, but also  atypical /refractory GERD symptoms like dysphagia and "bad heartburn"   The only way I know  to prove this is not an "ACEi Case" is a trial off ACEi x a minimum of 4 weeks then regroup.    Try benicar 40 mg daily and f/u in 4 weeks  Discussed in detail all the  indications, usual  risks and alternatives  relative to the benefits with patient who agrees to proceed with Rx as outlined.      Each maintenance medication was reviewed in detail including emphasizing most importantly the difference between maintenance and prns and under what circumstances the prns are to be triggered using an action plan format where appropriate.  Total time for H and P, chart review, counseling, reviewing hfa /dpi device(s) and generating customized AVS unique to this office visit / same day charting = 45 min with pt new to me       Cigarette smoker 4-5 min discussion re active cigarette smoking in addition to office E&M  Ask about tobacco use:   ongoing Advise quitting   I took an extended  opportunity with this patient to outline the consequences of continued cigarette use  in airway disorders based on all the data we have from the multiple national lung health studies (perfomed over decades at millions of dollars in cost)  indicating that smoking cessation, not choice of inhalers or pulmonary physicians, is the most important aspect of her  care.   Assess willingness:  Not fully committed at this point Assist in quit attempt:  Per PCP when ready Arrange follow up:   Follow up per Primary Care planned           Sandrea Hughs, MD 03/25/2023

## 2023-03-25 ENCOUNTER — Encounter: Payer: Self-pay | Admitting: Internal Medicine

## 2023-03-25 ENCOUNTER — Ambulatory Visit (INDEPENDENT_AMBULATORY_CARE_PROVIDER_SITE_OTHER): Payer: Medicaid Other | Admitting: Internal Medicine

## 2023-03-25 VITALS — BP 162/95 | HR 73 | Ht 68.0 in | Wt 198.0 lb

## 2023-03-25 DIAGNOSIS — I10 Essential (primary) hypertension: Secondary | ICD-10-CM | POA: Diagnosis not present

## 2023-03-25 DIAGNOSIS — J449 Chronic obstructive pulmonary disease, unspecified: Secondary | ICD-10-CM | POA: Diagnosis not present

## 2023-03-25 DIAGNOSIS — F17210 Nicotine dependence, cigarettes, uncomplicated: Secondary | ICD-10-CM | POA: Diagnosis not present

## 2023-03-25 MED ORDER — OLMESARTAN MEDOXOMIL 40 MG PO TABS: 40.0000 mg | ORAL_TABLET | Freq: Every day | ORAL | 11 refills | Status: AC

## 2023-03-25 MED ORDER — BREZTRI AEROSPHERE 160-9-4.8 MCG/ACT IN AERO: 2.0000 | INHALATION_SPRAY | Freq: Two times a day (BID) | RESPIRATORY_TRACT | Status: DC

## 2023-03-25 MED ORDER — ALBUTEROL SULFATE HFA 108 (90 BASE) MCG/ACT IN AERS: INHALATION_SPRAY | RESPIRATORY_TRACT | 11 refills | Status: DC

## 2023-03-25 NOTE — Assessment & Plan Note (Signed)
4-5 min discussion re active cigarette smoking in addition to office E&M  Ask about tobacco use:   ongoing Advise quitting   I took an extended  opportunity with this patient to outline the consequences of continued cigarette use  in airway disorders based on all the data we have from the multiple national lung health studies (perfomed over decades at millions of dollars in cost)  indicating that smoking cessation, not choice of inhalers or pulmonary physicians, is the most important aspect of her  care.   Assess willingness:  Not fully  committed at this point Assist in quit attempt:  Per PCP when ready Arrange follow up:   Follow up per Primary Care planned

## 2023-03-25 NOTE — Assessment & Plan Note (Addendum)
CT Chest  11/17/19 Mild centrilobular emphysema  - 03/25/2023  After extensive coaching inhaler device,  effectiveness =    75% (short Ti) try change trelegy to breztri 2bid samples and approp saba - 03/25/2023 d/c acei (see hbp)   GOLD 2 should not result in such severe doe/ cough/ noct wheeze if well controlled.  DDX of  difficult airways management almost all start with A and  include Adherence, Ace Inhibitors, Acid Reflux, Active Sinus Disease, Alpha 1 Antitripsin deficiency, Anxiety masquerading as Airways dz,  ABPA,  Allergy(esp in young), Aspiration (esp in elderly), Adverse effects of meds,  Active smoking or vaping, A bunch of PE's (a small clot burden can't cause this syndrome unless there is already severe underlying pulm or vascular dz with poor reserve) plus two Bs  = Bronchiectasis and Beta blocker use..and one C= CHF   Adherence is always the initial "prime suspect" and is a multilayered concern that requires a "trust but verify" approach in every patient - starting with knowing how to use medications, especially inhalers, correctly, keeping up with refills and understanding the fundamental difference between maintenance and prns vs those medications only taken for a very short course and then stopped and not refilled.  - see hfa teaching - return with all meds in hand using a trust but verify approach to confirm accurate Medication  Reconciliation The principal here is that until we are certain that the  patients are doing what we've asked, it makes no sense to ask them to do more.   ACEi adverse effects at the  top of the usual list of suspects and the only way to rule it out is a trial off > see a/p    ? Acid (or non-acid) GERD > always difficult to exclude as up to 75% of pts in some series report no assoc GI/ Heartburn symptoms> rec max (24h)  acid suppression by adding pm pepcid to am omeprazole at least until noct cough gone   Active smoking also near top of the list (see separate  a/p)

## 2023-03-25 NOTE — Assessment & Plan Note (Addendum)
D/c acei  03/25/2023 - pseudowheeze   In the best review of chronic cough to date ( NEJM 2016 375 1544-1551) ,  ACEi are now felt to cause cough in up to  20% of pts which is a 4 fold increase from previous reports and does not include the variety of non-specific complaints we see in pulmonary clinic in pts on ACEi but previously attributed to another dx like  Copd/asthma and  include PNDS, throat and chest congestion, "bronchitis", unexplained dyspnea and noct "strangling" sensations, and hoarseness, but also  atypical /refractory GERD symptoms like dysphagia and "bad heartburn"   The only way I know  to prove this is not an "ACEi Case" is a trial off ACEi x a minimum of 4 weeks then regroup.   Try benicar 40 mg daily and f/u in 4 weeks  Discussed in detail all the  indications, usual  risks and alternatives  relative to the benefits with patient who agrees to proceed with Rx as outlined.      Each maintenance medication was reviewed in detail including emphasizing most importantly the difference between maintenance and prns and under what circumstances the prns are to be triggered using an action plan format where appropriate.  Total time for H and P, chart review, counseling, reviewing hfa /dpi device(s) and generating customized AVS unique to this office visit / same day charting = 45 min with pt new to me

## 2023-03-25 NOTE — Patient Instructions (Addendum)
Stop trelegy and lisinopril   Olmesartan 40 mg one daily   Continue omeprazole Take 30-60 min before first meal of the day and add pepcid otc 20 mg at bedtime until night time cough and wheeze are gone   Plan A = Automatic = Always=    Breztri Take 2 puffs first thing in am and then another 2 puffs about 12 hours later.     Work on inhaler technique:  relax and gently blow all the way out then take a nice smooth full deep breath back in, triggering the inhaler at same time you start breathing in.  Hold breath in for at least  5 seconds if you can. Blow out breztri thru nose. Rinse and gargle with water when done.  If mouth or throat bother you at all,  try brushing teeth/gums/tongue with arm and hammer toothpaste/ make a slurry and gargle and spit out.       Plan B = Backup (to supplement plan A, not to replace it) Only use your albuterol inhaler as a rescue medication to be used if you can't catch your breath by resting or doing a relaxed purse lip breathing pattern.  - The less you use it, the better it will work when you need it. - Ok to use the inhaler up to 2 puffs  every 4 hours if you must but call for appointment if use goes up over your usual need - Don't leave home without it !!  (think of it like the spare tire for your car)    My office will be contacting you by phone for referral to lung cancer screening  [phone]  - if you don't hear back from my office within one week please call us back or notify us thru MyChart and we'll address it right away.  Please schedule a follow up office visit in 4 weeks, sooner if needed -bring inhalers

## 2023-03-26 ENCOUNTER — Encounter: Payer: Medicaid Other | Admitting: Orthopaedic Surgery

## 2023-03-27 ENCOUNTER — Encounter: Payer: Self-pay | Admitting: Orthopaedic Surgery

## 2023-03-27 ENCOUNTER — Ambulatory Visit: Payer: Medicaid Other | Admitting: Orthopaedic Surgery

## 2023-03-27 VITALS — BP 158/92 | HR 79 | Ht 68.0 in | Wt 198.0 lb

## 2023-03-27 DIAGNOSIS — G894 Chronic pain syndrome: Secondary | ICD-10-CM | POA: Diagnosis not present

## 2023-03-27 DIAGNOSIS — M79605 Pain in left leg: Secondary | ICD-10-CM

## 2023-03-27 DIAGNOSIS — M545 Low back pain, unspecified: Secondary | ICD-10-CM

## 2023-03-27 DIAGNOSIS — M79604 Pain in right leg: Secondary | ICD-10-CM | POA: Diagnosis not present

## 2023-03-27 MED ORDER — CYCLOBENZAPRINE HCL 10 MG PO TABS
10.0000 mg | ORAL_TABLET | Freq: Every day | ORAL | 0 refills | Status: DC
Start: 2023-03-27 — End: 2023-05-23

## 2023-03-27 MED ORDER — HYDROCODONE-ACETAMINOPHEN 5-325 MG PO TABS
1.0000 | ORAL_TABLET | ORAL | 0 refills | Status: AC | PRN
Start: 2023-03-27 — End: 2023-04-01

## 2023-03-27 MED ORDER — PREDNISONE 5 MG (21) PO TBPK: ORAL_TABLET | ORAL | 0 refills | Status: DC

## 2023-03-27 NOTE — Progress Notes (Signed)
My back hurts.  She has had back pain over the last three years getting worse more over the last four to five months.  She has pain that goes to both knees and just below, more on the right.  She has no trauma. She has tried ice, heat, rubs, Tylenol, Advil with no help. She awakens at night two times with the pain and has to get up and move about.  She has pain after sitting a while.  She is not getting better.  She has no weakness,no redness.  She saw Dr. Adriana Simas.  He gave her Mobic with no help.  Lower back is tender, she is diffusely tender of lumbar spine, ROM is decreased laterally to only 5 degrees bilaterally, extension 5, flexion 35, DTRs intact, SLR negative, gait is slow but good, muscle tone and strength normal.  I have reviewed X-rays done 7-10--24 of the lumbar spine.  I have independently reviewed and interpreted x-rays of this patient done at another site by another physician or qualified health professional.  Encounter Diagnoses  Name Primary?   Lumbar pain with radiation down both legs Yes   Chronic pain syndrome    I will order a MRI of the lumbar spine.  I am concerned about a HNP.    She has had conservative treatment with no help.  I have reviewed the [place] Controlled Substance Reporting System web site prior to prescribing narcotic medicine for this patient.  I will also give Flexeril and prednisone dose pack.  Return in three weeks.  Call if any problem.  Precautions discussed.  Electronically Signed Darreld Mclean, MD 9/18/20249:46 AM

## 2023-03-27 NOTE — Patient Instructions (Signed)
Central scheduling [phone]

## 2023-04-01 ENCOUNTER — Telehealth: Payer: Self-pay | Admitting: Orthopaedic Surgery

## 2023-04-01 NOTE — Telephone Encounter (Signed)
Dr. Sanjuan Dame pt - pt lvm requesting a refill on her pain meds, but she stating that they were not helping as much and would like something stronger sent to [facility] on [address]

## 2023-04-02 ENCOUNTER — Telehealth: Payer: Self-pay

## 2023-04-02 NOTE — Telephone Encounter (Signed)
Spoke to patient and advised her that dr Hilda Lias would not be refilling her pain meds because she was on suboxone in the past and I advised her to see the doctor that she was seeing for pain meds  Dr Hilda Lias stated he is not refilling any medication for this patient

## 2023-04-02 NOTE — Telephone Encounter (Signed)
Patient called again this afternoon about pain medicine. I called her back and told her exactly what Tori had in her chart. She stated that she doesn't see anyone else and she thought he had told her that he could refill it again in five days. I again told her exactly what Delorise Jackson had said and she asked if Delorise Jackson would please give her a call.

## 2023-04-03 ENCOUNTER — Telehealth: Payer: Self-pay | Admitting: Radiology

## 2023-04-03 NOTE — Telephone Encounter (Signed)
Patient's MRI cancelled for now, no auth.  Unsure if it will approve as she has had no PT.  We will see.    Patient has not been to the suboxone clinic in a long time.  Could you possibly refill hydrocodone 5/325 for her?  [facility], [address]

## 2023-04-04 ENCOUNTER — Ambulatory Visit (HOSPITAL_COMMUNITY): Payer: Medicaid Other

## 2023-04-05 MED ORDER — HYDROCODONE-ACETAMINOPHEN 5-325 MG PO TABS: ORAL_TABLET | ORAL | 0 refills | Status: DC

## 2023-04-05 NOTE — Addendum Note (Signed)
Addended by: Earnstine Regal on: 04/05/2023 08:46 AM   Modules accepted: Orders

## 2023-04-08 NOTE — Telephone Encounter (Signed)
Left voice mail telling patient that dr Hilda Lias believes she is with another doctor for her pain medicaton and I will discuss with him tomorrow and call her back

## 2023-04-16 ENCOUNTER — Ambulatory Visit: Payer: Medicaid Other | Admitting: Orthopaedic Surgery

## 2023-04-22 NOTE — Progress Notes (Deleted)
Lindsay Mcmillan, female    DOB: 1970-07-11    MRN: [id number]   Brief patient profile:  24  yowf  active smoker  referred to pulmonary clinic in [place]  03/25/2023 by Everlene Other DO  for freq bronchitis and doe x 2014 and worse since rx trelegy > no better.   GOLD 2 copd criteria 11/17/19   History of Present Illness  03/25/2023  Pulmonary/ 1st office eval/ [facility] / [place] [facility] on trelegy 100 and lisinopril 40  Chief Complaint  Patient presents with   Establish Care   Shortness of Breath  Dyspnea:  walking x 100 ft no better on trelegy (? Helped at first)  Cough: p sev hours asleep assoc with loud wheezing  Sleep: 2 pillws flat freq cough esp in am > clear  SABA use: last used it 2-3 weeks  02: none  Lung cancer screen: referred today  Rec Stop trelegy and lisinopril  Olmesartan 40 mg one daily  Continue omeprazole Take 30-60 min before first meal of the day and add pepcid otc 20 mg at bedtime until night time cough and wheeze are gone  Plan A = Automatic = Always=    Breztri Take 2 puffs first thing in am and then another 2 puffs about 12 hours later.  Work on inhaler technique:  Plan B = Backup (to supplement plan A, not to replace it) Only use your albuterol inhaler as a rescue medication  My office will be contacting you by phone for referral to lung cancer screening  [phone]  - if you don't hear back from my office within one week please call us back or notify us thru MyChart and we'll address it right away.  Please schedule a follow up office visit in 4 weeks, sooner if needed -bring inhalers    04/23/2023  f/u ov/[place] [place] [facility]/Falicity Sheets re: GOLD 2 copd/ ? ACEi case  maint on *** did *** bring inhalers  No chief complaint on file.   Dyspnea:  *** Cough: *** Sleeping: ***   resp cc  SABA use: *** 02: ***  Lung cancer screening: ***   No obvious day to day or daytime variability or assoc excess/ purulent sputum or mucus plugs or hemoptysis or cp or chest  tightness, subjective wheeze or overt sinus or hb symptoms.    Also denies any obvious fluctuation of symptoms with weather or environmental changes or other aggravating or alleviating factors except as outlined above   No unusual exposure hx or h/o childhood pna/ asthma or knowledge of premature birth.  Current Allergies, Complete Past Medical History, Past Surgical History, Family History, and Social History were reviewed in [employer] record.  ROS  The following are not active complaints unless bolded Hoarseness, sore throat, dysphagia, dental problems, itching, sneezing,  nasal congestion or discharge of excess mucus or purulent secretions, ear ache,   fever, chills, sweats, unintended wt loss or wt gain, classically pleuritic or exertional cp,  orthopnea pnd or arm/hand swelling  or leg swelling, presyncope, palpitations, abdominal pain, anorexia, nausea, vomiting, diarrhea  or change in bowel habits or change in bladder habits, change in stools or change in urine, dysuria, hematuria,  rash, arthralgias, visual complaints, headache, numbness, weakness or ataxia or problems with walking or coordination,  change in mood or  memory.        No outpatient medications have been marked as taking for the 04/23/23 encounter (Appointment) with Nyoka Cowden, MD.  Past Medical History:  Diagnosis Date   BOILS, RECURRENT 10/10/2009   Qualifier: Diagnosis of  By: De Blanch.    Chest pain in adult 09/13/2019   COPD (chronic obstructive pulmonary disease) (HCC)    Encounter for screening mammogram for malignant neoplasm of breast 08/11/2019   Inguinal pain, right 12/09/2020   Substance abuse (HCC)    Tobacco dependence    Wound dehiscence       Objective:    Wt Readings from Last 3 Encounters:  03/27/23 198 lb (89.8 kg)  03/25/23 198 lb (89.8 kg)  02/20/23 196 lb 9.6 oz (89.2 kg)      Vital signs reviewed  04/23/2023  - Note at rest 02 sats   ***% on ***   General appearance:    ***   Min barr***          Assessment

## 2023-04-23 ENCOUNTER — Ambulatory Visit: Payer: MEDICAID | Admitting: Internal Medicine

## 2023-04-23 ENCOUNTER — Encounter: Payer: Self-pay | Admitting: Internal Medicine

## 2023-05-13 ENCOUNTER — Ambulatory Visit (HOSPITAL_COMMUNITY)
Admission: RE | Admit: 2023-05-13 | Discharge: 2023-05-13 | Disposition: A | Discharge status: Home / Self Care | Payer: MEDICAID | Source: Ambulatory Visit | Attending: Orthopaedic Surgery | Admitting: Orthopaedic Surgery

## 2023-05-13 DIAGNOSIS — M79604 Pain in right leg: Secondary | ICD-10-CM | POA: Diagnosis present

## 2023-05-13 DIAGNOSIS — G894 Chronic pain syndrome: Secondary | ICD-10-CM | POA: Diagnosis present

## 2023-05-13 DIAGNOSIS — M79605 Pain in left leg: Secondary | ICD-10-CM | POA: Insufficient documentation

## 2023-05-13 DIAGNOSIS — M545 Low back pain, unspecified: Secondary | ICD-10-CM | POA: Diagnosis present

## 2023-05-22 ENCOUNTER — Telehealth: Payer: Self-pay | Admitting: Orthopaedic Surgery

## 2023-05-22 ENCOUNTER — Other Ambulatory Visit: Payer: Self-pay | Admitting: Family Medicine

## 2023-05-22 ENCOUNTER — Telehealth: Payer: Self-pay

## 2023-05-22 ENCOUNTER — Other Ambulatory Visit: Payer: Self-pay | Admitting: Internal Medicine

## 2023-05-22 ENCOUNTER — Other Ambulatory Visit (HOSPITAL_COMMUNITY): Payer: Self-pay

## 2023-05-22 MED ORDER — OMEPRAZOLE 40 MG PO CPDR
40.0000 mg | DELAYED_RELEASE_CAPSULE | Freq: Every day | ORAL | 1 refills | Status: DC
  Filled 2023-05-22: qty 90, 90d supply, fill #0

## 2023-05-22 MED ORDER — BREZTRI AEROSPHERE 160-9-4.8 MCG/ACT IN AERO: 2.0000 | INHALATION_SPRAY | Freq: Two times a day (BID) | RESPIRATORY_TRACT | 3 refills | Status: DC

## 2023-05-22 NOTE — Telephone Encounter (Signed)
Spoke to patient and let her know that we have received the letter from [facility] that they released her and I let her know that Dr Hilda Lias still could not write pain meds for her per state regulations and she said that she was on methadone and it did not help her any

## 2023-05-22 NOTE — Telephone Encounter (Signed)
Dr. Sanjuan Dame pt - spoke w/the patient, she's requesting a call back from Tori regarding a form for her medications from [facility].  [phone]

## 2023-05-23 ENCOUNTER — Telehealth: Payer: Self-pay | Admitting: Orthopaedic Surgery

## 2023-05-23 ENCOUNTER — Other Ambulatory Visit: Payer: Self-pay | Admitting: Family Medicine

## 2023-05-23 ENCOUNTER — Other Ambulatory Visit: Payer: Self-pay

## 2023-05-23 ENCOUNTER — Other Ambulatory Visit (HOSPITAL_COMMUNITY): Payer: Self-pay

## 2023-05-23 MED ORDER — BUDESONIDE-FORMOTEROL FUMARATE 160-4.5 MCG/ACT IN AERO
2.0000 | INHALATION_SPRAY | Freq: Two times a day (BID) | RESPIRATORY_TRACT | 1 refills | Status: DC
Start: 2023-05-23 — End: 2024-04-27

## 2023-05-23 MED ORDER — OMEPRAZOLE 40 MG PO CPDR: 40.0000 mg | DELAYED_RELEASE_CAPSULE | Freq: Every day | ORAL | 1 refills | Status: DC

## 2023-05-23 NOTE — Telephone Encounter (Signed)
FYI pt was a no show to her last appt in 10/24

## 2023-05-23 NOTE — Telephone Encounter (Signed)
Called and spoke w/ pt informed that she will need to make an follow up appt  and that we can sent in a rx for Symbicort sent in refill to last until her appt that she made for 06/19/23 also sent in refill for acid reflux, pt stated that she need refill on her b/p medications  informed pt she should have refill that refill were sent to the pharmacy on her last ov in sept of this year x 11. Advised pt to check with pharmacy.

## 2023-05-24 ENCOUNTER — Encounter (INDEPENDENT_AMBULATORY_CARE_PROVIDER_SITE_OTHER): Payer: Self-pay

## 2023-05-26 ENCOUNTER — Encounter: Payer: Self-pay | Admitting: Orthopaedic Surgery

## 2023-05-26 MED ORDER — MELOXICAM 15 MG PO TABS: 15.0000 mg | ORAL_TABLET | Freq: Every day | ORAL | 0 refills | Status: DC | PRN

## 2023-05-30 ENCOUNTER — Ambulatory Visit: Payer: MEDICAID | Admitting: Orthopaedic Surgery

## 2023-05-30 MED ORDER — CYCLOBENZAPRINE HCL 10 MG PO TABS: 10.0000 mg | ORAL_TABLET | Freq: Every day | ORAL | 0 refills | Status: DC

## 2023-06-12 ENCOUNTER — Ambulatory Visit: Payer: MEDICAID | Admitting: Orthopaedic Surgery

## 2023-06-14 ENCOUNTER — Encounter (INDEPENDENT_AMBULATORY_CARE_PROVIDER_SITE_OTHER): Payer: Self-pay

## 2023-06-18 ENCOUNTER — Ambulatory Visit: Payer: MEDICAID | Admitting: Family Medicine

## 2023-06-18 ENCOUNTER — Ambulatory Visit (INDEPENDENT_AMBULATORY_CARE_PROVIDER_SITE_OTHER): Payer: MEDICAID | Admitting: Family Medicine

## 2023-06-18 VITALS — BP 164/87 | HR 67 | Temp 97.9°F | Ht 68.0 in | Wt 209.2 lb

## 2023-06-18 DIAGNOSIS — M5442 Lumbago with sciatica, left side: Secondary | ICD-10-CM

## 2023-06-18 DIAGNOSIS — I10 Essential (primary) hypertension: Secondary | ICD-10-CM

## 2023-06-18 DIAGNOSIS — M5441 Lumbago with sciatica, right side: Secondary | ICD-10-CM

## 2023-06-18 DIAGNOSIS — M48062 Spinal stenosis, lumbar region with neurogenic claudication: Secondary | ICD-10-CM | POA: Diagnosis not present

## 2023-06-18 DIAGNOSIS — R600 Localized edema: Secondary | ICD-10-CM | POA: Diagnosis not present

## 2023-06-18 DIAGNOSIS — G8929 Other chronic pain: Secondary | ICD-10-CM

## 2023-06-18 MED ORDER — HYDROCHLOROTHIAZIDE 25 MG PO TABS: 25.0000 mg | ORAL_TABLET | Freq: Every day | ORAL | 3 refills | Status: AC

## 2023-06-18 MED ORDER — FUROSEMIDE 20 MG PO TABS: 20.0000 mg | ORAL_TABLET | Freq: Every day | ORAL | 0 refills | Status: DC | PRN

## 2023-06-18 NOTE — Patient Instructions (Signed)
Medications as directed.  Elevate legs.  Referral placed.  Dr. Adriana Simas

## 2023-06-18 NOTE — Progress Notes (Unsigned)
Lindsay Mcmillan, female    DOB: 07/02/1971    MRN: [id number]   Brief patient profile:  25  yowf  active smoker  referred to pulmonary clinic in [place]  03/25/2023 by Lindsay Other DO  for freq bronchitis and doe x 2014 and worse since rx trelegy > no better.   GOLD 2 copd criteria 11/17/19   History of Present Illness  03/25/2023  Pulmonary/ 1st office eval/ Lindsay Mcmillan / [place] [facility] on trelegy 100 and lisinopril 40  Chief Complaint  Patient presents with   Establish Care   Shortness of Breath  Dyspnea:  walking x 100 ft no better on trelegy (? Helped at first)  Cough: p sev hours asleep assoc with loud wheezing  Sleep: 2 pillws flat freq cough esp in am > clear  SABA use: last used it 2-3 weeks  02: none  Lung cancer screen: referred today  Rec Stop trelegy and lisinopril  Olmesartan 40 mg one daily  Continue omeprazole Take 30-60 min before first meal of the day and add pepcid otc 20 mg at bedtime until night time cough and wheeze are gone  Plan A = Automatic = Always=    Breztri Take 2 puffs first thing in am and then another 2 puffs about 12 hours later.  Work on inhaler technique:  Plan B = Backup (to supplement plan A, not to replace it) Only use your albuterol inhaler as a rescue medication  My office will be contacting you by phone for referral to lung cancer screening  [phone]  - did not do as of 06/19/2023  - if you don't hear back from my office within one week please call us back or notify us thru MyChart and we'll address it right away.  Please schedule a follow up office visit in 4 weeks, sooner if needed -bring inhalers    06/19/2023  f/u ov/[place] office/Lindsay Mcmillan re: *** maint on *** did *** bring inhalers needs LDST *** No chief complaint on file.   Dyspnea:  *** Cough: *** Sleeping: ***   resp cc  SABA use: *** 02: ***  Lung cancer screening: ***   No obvious day to day or daytime variability or assoc excess/ purulent sputum or mucus plugs or  hemoptysis or cp or chest tightness, subjective wheeze or overt sinus or hb symptoms.    Also denies any obvious fluctuation of symptoms with weather or environmental changes or Mcmillan aggravating or alleviating factors except as outlined above   No unusual exposure hx or h/o childhood pna/ asthma or knowledge of premature birth.  Current Allergies, Complete Past Medical History, Past Surgical History, Family History, and Social History were reviewed in [employer] record.  ROS  The following are not active complaints unless bolded Hoarseness, sore throat, dysphagia, dental problems, itching, sneezing,  nasal congestion or discharge of excess mucus or purulent secretions, ear ache,   fever, chills, sweats, unintended wt loss or wt gain, classically pleuritic or exertional cp,  orthopnea pnd or arm/hand swelling  or leg swelling, presyncope, palpitations, abdominal pain, anorexia, nausea, vomiting, diarrhea  or change in bowel habits or change in bladder habits, change in stools or change in urine, dysuria, hematuria,  rash, arthralgias, visual complaints, headache, numbness, weakness or ataxia or problems with walking or coordination,  change in mood or  memory.        No outpatient medications have been marked as taking for the 06/19/23 encounter (Appointment) with Nyoka Cowden, MD.  Past Medical History:  Diagnosis Date   BOILS, RECURRENT 10/10/2009   Qualifier: Diagnosis of  By: De Blanch.    Chest pain in adult 09/13/2019   COPD (chronic obstructive pulmonary disease) (HCC)    Encounter for screening mammogram for malignant neoplasm of breast 08/11/2019   Inguinal pain, right 12/09/2020   Substance abuse (HCC)    Tobacco dependence    Wound dehiscence       Objective:    wts   Wt Readings from Last 3 Encounters:  03/27/23 198 lb (89.8 kg)  03/25/23 198 lb (89.8 kg)  02/20/23 196 lb 9.6 oz (89.2 kg)      Vital signs reviewed   06/19/2023  - Note at rest 02 sats  ***% on ***   General appearance:    ***   ,  Min ba r***      Assessment

## 2023-06-19 ENCOUNTER — Ambulatory Visit: Payer: MEDICAID | Admitting: Internal Medicine

## 2023-06-19 NOTE — Assessment & Plan Note (Signed)
Uncontrolled.  Advised compliance with medication.  HCTZ refilled.  This should help edema as well.  Needs to elevate.

## 2023-06-19 NOTE — Assessment & Plan Note (Signed)
MAR I reviewed with the patient today.  She has significant degenerative changes/spinal stenosis as well as neuroforaminal stenosis.  Referring to neurosurgery.

## 2023-06-19 NOTE — Progress Notes (Signed)
Subjective:  Patient ID: Lindsay Mcmillan, female    DOB: 1970-08-10  Age: 52 y.o. MRN: [id number]  CC:  Continued back pain, review MRI   HPI:  52 year old female presents for evaluation of the above.  Patient has recently been seen by orthopedics.  MRI of the lumbar spine was obtained.  She would like to review this today.  She reports continued severe pain of the lumbar spine with radiculopathy down both sides.  Blood pressure is uncontrolled.  She has not been taking her HCTZ.  Patient has just taken her medication today. She reports an increase in edema of the hands as well as the feet and ankles.  Patient Active Problem List   Diagnosis Date Noted   Cigarette smoker 03/25/2023   History of substance abuse (HCC) 01/06/2023   Chronic bilateral low back pain 01/06/2023   Excessive daytime sleepiness 01/04/2023   Jerking movements of extremities 01/04/2023   Stimulant use disorder 07/06/2022   Essential hypertension 04/20/2020   Depression 04/20/2020   Snoring 04/20/2020   COPD GOLD  2 08/11/2019    Social Hx   Social History   Socioeconomic History   Marital status: Single    Spouse name: Not on file   Number of children: Not on file   Years of education: Not on file   Highest education level: Not on file  Occupational History   Not on file  Tobacco Use   Smoking status: Every Day    Current packs/day: 2.00    Average packs/day: 2.0 packs/day for 30.0 years (60.0 ttl pk-yrs)    Types: Cigarettes   Smokeless tobacco: Never  Vaping Use   Vaping status: Former  Substance and Sexual Activity   Alcohol use: No   Drug use: Yes    Types: Cocaine    Comment: fentanyl   Sexual activity: Yes    Birth control/protection: None  Other Topics Concern   Not on file  Social History Narrative   Lives with boyfriend of 17 years      Enjoys: limited       Diet: eats all food groups    Caffeine: 20 oz of coke    Water: never       Wears seat    Does not use phone  while driving    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   Does not Database administrator   Social Determinants of Health   Financial Resource Strain: Low Risk  (04/20/2020)   Overall Financial Resource Strain (CARDIA)    Difficulty of Paying Living Expenses: Not hard at all  Food Insecurity: Low Risk  (07/06/2022)   [facility]   Hunger Vital Sign    Worried About Running Out of Food in the Last Year: Never true    Within the past 12 months, the food you bought just didn't last and you didn't have money to get more: Not on file  Transportation Needs: Not on file (07/06/2022)  Physical Activity: Sufficiently Active (04/20/2020)   Exercise Vital Sign    Days of Exercise per Week: 4 days    Minutes of Exercise per Session: 60 min  Stress: Stress Concern Present (04/20/2020)   [employer] of Occupational Health - Occupational Stress Questionnaire    Feeling of Stress : Very much  Social Connections: Moderately Integrated (04/20/2020)   Social Connection and Isolation Panel [NHANES]    Frequency of Communication with Friends and Family: More than three times  a week    Frequency of Social Gatherings with Friends and Family: Once a week    Attends Religious Services: More than 4 times per year    Active Member of [employer] or Organizations: No    Attends Engineer, structural: Never    Marital Status: Living with partner    Review of Systems Per HPI  Objective:  BP (!) 164/87   Pulse 67   Temp 97.9 F (36.6 C)   Ht 5\' 8"  (1.727 m)   Wt 209 lb 3.2 oz (94.9 kg)   SpO2 98%   BMI 31.81 kg/m      06/18/2023    3:00 PM 06/18/2023    2:30 PM 03/27/2023    8:48 AM  BP/Weight  Systolic BP 164 175 158  Diastolic BP 87 100 92  Wt. (Lbs)  209.2 198  BMI  31.81 kg/m2 30.11 kg/m2    Physical Exam Vitals and nursing note reviewed.  Constitutional:      General: She is not in acute distress. HENT:     Head: Normocephalic and atraumatic.   Cardiovascular:     Rate and Rhythm: Normal rate and regular rhythm.  Pulmonary:     Effort: Pulmonary effort is normal.     Breath sounds: Normal breath sounds. No wheezing, rhonchi or rales.  Musculoskeletal:     Comments: Bilateral lower extremity edema, 2+  Neurological:     Mental Status: She is alert.     Lab Results  Component Value Date   WBC 13.1 (H) 02/04/2023   HGB 12.1 02/04/2023   HCT 39.3 02/04/2023   PLT 193 02/04/2023   GLUCOSE 98 02/04/2023   CHOL 158 05/16/2020   TRIG 76 05/16/2020   HDL 50 05/16/2020   LDLCALC 93 05/16/2020   ALT 28 03/07/2022   AST 17 03/07/2022   NA 136 02/04/2023   K 3.8 02/04/2023   CL 106 02/04/2023   CREATININE 0.97 02/04/2023   BUN 17 02/04/2023   CO2 26 02/04/2023     Assessment & Plan:   Problem List Items Addressed This Visit       Cardiovascular and Mediastinum   Essential hypertension    Uncontrolled.  Advised compliance with medication.  HCTZ refilled.  This should help edema as well.  Needs to elevate.      Relevant Medications   hydrochlorothiazide (HYDRODIURIL) 25 MG tablet   furosemide (LASIX) 20 MG tablet     Other   Chronic bilateral low back pain - Primary    MAR I reviewed with the patient today.  She has significant degenerative changes/spinal stenosis as well as neuroforaminal stenosis.  Referring to neurosurgery.      Relevant Orders   Ambulatory referral to Neurosurgery   Other Visit Diagnoses     Lower extremity edema       Relevant Orders   CMP14+EGFR   Brain natriuretic peptide   Spinal stenosis of lumbar region with neurogenic claudication       Relevant Orders   Ambulatory referral to Neurosurgery       Meds ordered this encounter  Medications   hydrochlorothiazide (HYDRODIURIL) 25 MG tablet    Sig: Take 1 tablet (25 mg total) by mouth daily.    Dispense:  90 tablet    Refill:  3   furosemide (LASIX) 20 MG tablet    Sig: Take 1 tablet (20 mg total) by mouth daily as needed  for edema.    Dispense:  30  tablet    Refill:  0    Jessejames Steelman DO [facility]

## 2023-07-12 NOTE — Progress Notes (Deleted)
 Referring Physician:  Cook, Jayce G, DO [address] [place],  [place] [address]  Primary Physician:  Cook, Jayce G, DO  History of Present Illness: 07/12/2023 Ms. Lindsay Mcmillan is here today with a chief complaint of ***  Low back pain  Duration: *** Location: *** Quality: *** Severity: ***  Precipitating: aggravated by *** Modifying factors: made better by *** Weakness: none Timing: *** Bowel/Bladder Dysfunction: none  Conservative measures:  Physical therapy: has not participated in PT  Multimodal medical therapy including regular antiinflammatories: Tylenol , Flexeril ,   Injections: no epidural steroid injections  Past Surgery: none  Lindsay Mcmillan has ***no symptoms of cervical myelopathy.  The symptoms are causing a significant impact on the patient's life.   Review of Systems:  A 10 point review of systems is negative, except for the pertinent positives and negatives detailed in the HPI.  Past Medical History: Past Medical History:  Diagnosis Date   BOILS, RECURRENT 10/10/2009   Qualifier: Diagnosis of  By: Ezzard DEVONNA Sonny GORMAN.    Chest pain in adult 09/13/2019   COPD (chronic obstructive pulmonary disease) (HCC)    Encounter for screening mammogram for malignant neoplasm of breast 08/11/2019   Inguinal pain, right 12/09/2020   Substance abuse (HCC)    Tobacco dependence    Wound dehiscence     Past Surgical History: Past Surgical History:  Procedure Laterality Date   ABDOMINAL HYSTERECTOMY     CESAREAN SECTION      Allergies: Allergies as of 07/17/2023 - Review Complete 06/18/2023  Allergen Reaction Noted   Dalbavancin Anaphylaxis 03/07/2022   Vancomycin Hives 09/02/2022    Medications: Outpatient Encounter Medications as of 07/17/2023  Medication Sig   cyclobenzaprine  (FLEXERIL ) 10 MG tablet Take 1 tablet (10 mg total) by mouth at bedtime. One tablet every night at bedtime as needed for spasm.   acetaminophen  (TYLENOL ) 500 MG  tablet Take 1,000 mg by mouth every 6 (six) hours as needed for mild pain or headache.   albuterol  (VENTOLIN  HFA) 108 (90 Base) MCG/ACT inhaler Up to 2 puffs every 4 hours as needed   budesonide -formoterol  (SYMBICORT ) 160-4.5 MCG/ACT inhaler Inhale 2 puffs into the lungs 2 (two) times daily.   furosemide  (LASIX ) 20 MG tablet Take 1 tablet (20 mg total) by mouth daily as needed for edema.   hydrochlorothiazide  (HYDRODIURIL ) 25 MG tablet Take 1 tablet (25 mg total) by mouth daily.   loratadine (CLARITIN) 10 MG tablet Take 1 tablet by mouth daily.   meclizine  (ANTIVERT ) 25 MG tablet Take 1 tablet (25 mg total) by mouth 2 (two) times daily as needed for dizziness.   olmesartan  (BENICAR ) 40 MG tablet Take 1 tablet (40 mg total) by mouth daily.   omeprazole  (PRILOSEC) 40 MG capsule Take 1 capsule (40 mg total) by mouth daily.   ondansetron  (ZOFRAN -ODT) 4 MG disintegrating tablet Take 1 tablet (4 mg total) by mouth every 8 (eight) hours as needed for nausea or vomiting.   No facility-administered encounter medications on file as of 07/17/2023.    Social History: Social History   Tobacco Use   Smoking status: Every Day    Current packs/day: 2.00    Average packs/day: 2.0 packs/day for 30.0 years (60.0 ttl pk-yrs)    Types: Cigarettes   Smokeless tobacco: Never  Vaping Use   Vaping status: Former  Substance Use Topics   Alcohol use: No   Drug use: Yes    Types: Cocaine    Comment: fentanyl  Family Medical History: Family History  Problem Relation Age of Onset   Diabetes Other    Cancer Other     Physical Examination: @VITALWITHPAIN @  General: Patient is well developed, well nourished, calm, collected, and in no apparent distress. Attention to examination is appropriate.  Psychiatric: Patient is non-anxious.  Head:  Pupils equal, round, and reactive to light.  ENT:  Oral mucosa appears well hydrated.  Neck:   Supple.  ***Full range of motion.  Respiratory: Patient is  breathing without any difficulty.  Extremities: No edema.  Vascular: Palpable dorsal pedal pulses.  Skin:   On exposed skin, there are no abnormal skin lesions.  NEUROLOGICAL:     Awake, alert, oriented to person, place, and time.  Speech is clear and fluent. Fund of knowledge is appropriate.   Cranial Nerves: Pupils equal round and reactive to light.  Facial tone is symmetric.  Facial sensation is symmetric.  ROM of spine: ***full.  Palpation of spine: ***non tender.    Strength: Side Biceps Triceps Deltoid Interossei Grip Wrist Ext. Wrist Flex.  R 5 5 5 5 5 5 5   L 5 5 5 5 5 5 5    Side Iliopsoas Quads Hamstring PF DF EHL  R 5 5 5 5 5 5   L 5 5 5 5 5 5    Reflexes are ***2+ and symmetric at the biceps, triceps, brachioradialis, patella and achilles.   Hoffman's is absent.  Clonus is not present.  Toes are down-going.  Bilateral upper and lower extremity sensation is intact to light touch.    Gait is normal.   No difficulty with tandem gait.   No evidence of dysmetria noted.  Medical Decision Making  Imaging: ***  I have personally reviewed the images and agree with the above interpretation.  Assessment and Plan: Lindsay Mcmillan is a pleasant 53 y.o. female with ***    Thank you for involving me in the care of this patient.   I spent a total of *** minutes in both face-to-face and non-face-to-face activities for this visit on the date of this encounter.   Lyle Decamp, PA-C Dept. of Neurosurgery

## 2023-07-14 NOTE — Progress Notes (Signed)
 Lindsay Mcmillan, female    DOB: 1970-09-15    MRN: [id number]   Brief patient profile:  54 yowf  active smoker  GOLD 2 copd criteria 11/17/19  referred to pulmonary clinic in [facility]  03/25/2023 by Jacqulyn Ahle DO  for freq bronchitis and doe x 2014 and worse since rx trelegy > no better.     History of Present Illness  03/25/2023  Pulmonary/ 1st office eval/ Tru Leopard / [place] [facility] on trelegy 100 and lisinopril  40  Chief Complaint  Patient presents with   Establish Care   Shortness of Breath  Dyspnea:  walking x 100 ft no better on trelegy (? Helped at first)  Cough: p sev hours asleep assoc with loud wheezing  Sleep: 2 pillws flat freq cough esp in am > clear  SABA use: last used it 2-3 weeks  02: none  Lung cancer screen: referred today  Rec Stop trelegy and lisinopril   Olmesartan  40 mg one daily  Continue omeprazole  Take 30-60 min before first meal of the day and add pepcid  otc 20 mg at bedtime until night time cough and wheeze are gone  Plan A = Automatic = Always=    Breztri  Take 2 puffs first thing in am and then another 2 puffs about 12 hours later.  Work on inhaler technique:  Plan B = Backup (to supplement plan A, not to replace it) Only use your albuterol  inhaler as a rescue medication  My office will be contacting you by phone for referral to lung cancer screening  [phone]  - did not do as of 07/16/2023  - if you don't hear back from my office within one week please call us  back or notify us  thru MyChart and we'll address it right away.  Please schedule a follow up office visit in 4 weeks, sooner if needed -bring inhalers    07/16/2023  f/u ov/[place] office/Dreyden Rohrman re: GOLD 2 copd /? Acei case  maint on symbicort  160  did not  bring inhalers   Chief Complaint  Patient presents with   COPD  Dyspnea:  very inactive at home/ agoraphobia  Cough: better  Sleeping: no resp cc p saba qh    resp cc  SABA use: bid and at bedtime  02: none   Lung cancer screening:  today   No obvious day to day or daytime variability or assoc excess/ purulent sputum or mucus plugs or hemoptysis or cp or chest tightness, subjective wheeze or overt sinus or hb symptoms.    Also denies any obvious fluctuation of symptoms with weather or environmental changes or other aggravating or alleviating factors except as outlined above   No unusual exposure hx or h/o childhood pna/ asthma or knowledge of premature birth.  Current Allergies, Complete Past Medical History, Past Surgical History, Family History, and Social History were reviewed in [employer] record.  ROS  The following are not active complaints unless bolded Hoarseness, sore throat, dysphagia= mild globus sensation , dental problems, itching, sneezing,  nasal congestion or discharge of excess mucus or purulent secretions, ear ache,   fever, chills, sweats, unintended wt loss or wt gain, classically pleuritic or exertional cp,  orthopnea pnd or arm/hand swelling  or leg swelling, presyncope, palpitations, abdominal pain, anorexia, nausea, vomiting, diarrhea  or change in bowel habits or change in bladder habits, change in stools or change in urine, dysuria, hematuria,  rash, arthralgias, visual complaints, headache, numbness, weakness or ataxia or problems with walking or coordination,  change in mood= more anxious or  memory.        Current Meds  Medication Sig   acetaminophen  (TYLENOL ) 500 MG tablet Take 1,000 mg by mouth every 6 (six) hours as needed for mild pain or headache.   albuterol  (VENTOLIN  HFA) 108 (90 Base) MCG/ACT inhaler Up to 2 puffs every 4 hours as needed   cyclobenzaprine  (FLEXERIL ) 10 MG tablet Take 1 tablet (10 mg total) by mouth at bedtime. One tablet every night at bedtime as needed for spasm.   hydrochlorothiazide  (HYDRODIURIL ) 25 MG tablet Take 1 tablet (25 mg total) by mouth daily.   meclizine  (ANTIVERT ) 25 MG tablet Take 1 tablet (25 mg total) by mouth 2 (two) times daily  as needed for dizziness.   olmesartan  (BENICAR ) 40 MG tablet Take 1 tablet (40 mg total) by mouth daily.   omeprazole  (PRILOSEC) 40 MG capsule Take 1 capsule (40 mg total) by mouth daily.   ondansetron  (ZOFRAN -ODT) 4 MG disintegrating tablet Take 1 tablet (4 mg total) by mouth every 8 (eight) hours as needed for nausea or vomiting.            Past Medical History:  Diagnosis Date   BOILS, RECURRENT 10/10/2009   Qualifier: Diagnosis of  By: Ezzard DEVONNA Sonny GORMAN.    Chest pain in adult 09/13/2019   COPD (chronic obstructive pulmonary disease) (HCC)    Encounter for screening mammogram for malignant neoplasm of breast 08/11/2019   Inguinal pain, right 12/09/2020   Substance abuse (HCC)    Tobacco dependence    Wound dehiscence       Objective:    wts   Wt Readings from Last 3 Encounters:  07/16/23 201 lb (91.2 kg)  06/18/23 209 lb 3.2 oz (94.9 kg)  03/27/23 198 lb (89.8 kg)     Vital signs reviewed  07/16/2023  - Note at rest 02 sats  93% on RA   General appearance:    amb anxious wf nad      HEENT : Oropharynx  clear   Nasal turbinates nl    NECK :  without  apparent JVD/ palpable Nodes/TM    LUNGS: no acc muscle use,  Min barrel  contour chest wall with bilateral  scatterd exp rhonchi  and  without cough on insp or exp maneuvers and min  Hyperresonant  to  percussion bilaterally    CV:  RRR  no s3 or murmur or increase in P2, and no edema   ABD:  soft and nontender with pos end  insp Hoover's  in the supine position.  No bruits or organomegaly appreciated   MS:  Nl gait/ ext warm without deformities Or obvious joint restrictions  calf tenderness, cyanosis or clubbing     SKIN: warm and dry without lesions    NEURO:  alert, approp, nl sensorium with  no motor or cerebellar deficits apparent.              Assessment

## 2023-07-16 ENCOUNTER — Encounter: Payer: Self-pay | Admitting: Internal Medicine

## 2023-07-16 ENCOUNTER — Ambulatory Visit (INDEPENDENT_AMBULATORY_CARE_PROVIDER_SITE_OTHER): Payer: MEDICAID | Admitting: Internal Medicine

## 2023-07-16 VITALS — BP 156/96 | HR 92 | Ht 68.0 in | Wt 201.0 lb

## 2023-07-16 DIAGNOSIS — J449 Chronic obstructive pulmonary disease, unspecified: Secondary | ICD-10-CM

## 2023-07-16 DIAGNOSIS — I10 Essential (primary) hypertension: Secondary | ICD-10-CM | POA: Diagnosis not present

## 2023-07-16 DIAGNOSIS — F17210 Nicotine dependence, cigarettes, uncomplicated: Secondary | ICD-10-CM

## 2023-07-16 MED ORDER — ALBUTEROL SULFATE (2.5 MG/3ML) 0.083% IN NEBU: 2.5000 mg | INHALATION_SOLUTION | RESPIRATORY_TRACT | 12 refills | Status: DC | PRN

## 2023-07-16 NOTE — Patient Instructions (Addendum)
 My office will be contacting you by phone for referral to lung cancer screening   - if you don't hear back from my office within one week please call us  back or notify us  thru MyChart and we'll address it right away.   Plan A = Automatic = Always=    Symbicort  160 Take 2 puffs first thing in am and then another 2 puffs about 12 hours later.    Work on inhaler technique:  relax and gently blow all the way out then take a nice smooth full deep breath back in, triggering the inhaler at same time you start breathing in.  Hold breath in for at least  5 seconds if you can. Blow out symbicort   thru nose. Rinse and gargle with water when done.  If mouth or throat bother you at all,  try brushing teeth/gums/tongue with arm and hammer toothpaste/ make a slurry and gargle and spit out.      Plan B = Backup (to supplement plan A, not to replace it) Only use your albuterol  inhaler as a rescue medication to be used if you can't catch your breath by resting or doing a relaxed purse lip breathing pattern.  - The less you use it, the better it will work when you need it. - Ok to use the inhaler up to 2 puffs  every 4 hours if you must but call for appointment if use goes up over your usual need - Don't leave home without it !!  (think of it like the spare tire for your car)   Plan C = Crisis (instead of Plan B but only if Plan B stops working) - only use your albuterol  nebulizer if you first try Plan B and it fails to help > ok to use the nebulizer up to every 4 hours but if start needing it regularly call for immediate appointment  Also  Ok to try albuterol  15 min before an activity (on alternating days)  that you know would usually make you short of breath and see if it makes any difference and if makes none then don't take albuterol  after activity unless you can't catch your breath as this means it's the resting that helps, not the albuterol .       Please schedule a follow up visit in 3 months but call sooner  if needed - bring inhalers

## 2023-07-17 ENCOUNTER — Ambulatory Visit: Payer: MEDICAID | Admitting: Physician Assistant

## 2023-07-17 NOTE — Assessment & Plan Note (Signed)
 D/c acei  03/25/2023 - pseudowheeze   Bp not well controlled / says compliant but anxiety really severe so may benefit from beta blocker > if so In the setting of refractory  respiratory symptoms ,  It would be preferable to use bystolic, the most beta -1  selective Beta blocker available in sample form, with bisoprolol the most selective generic choice  on the market, at least on a trial basis, to make sure the spillover Beta 2 effects of the less specific Beta blockers are not contributing to this patient's symptoms.   >>> advised to f/u with pcp

## 2023-07-17 NOTE — Assessment & Plan Note (Signed)
 Active Smoker - PFT's  11/17/19  FEV1 2.16 (68 % ) ratio 0.63  p 6% improvement from saba p ? prior to study with DLCO  12.74 (53%)   and FV curve flat insp portion and ERV 47 at wt 194 - CT Chest  11/17/19 Mild centrilobular emphysema  - 03/25/2023  After extensive coaching inhaler device,  effectiveness =    75% (short Ti) try change trelegy to breztri  2bid samples and approp saba - 03/25/2023 d/c acei (see hbp)  - 07/16/2023  After extensive coaching inhaler device,  effectiveness =    75% hfa (short ti)   Active AB > copd at this point so approp for symbicort  but if feels gets more from sample of breztri  can add spiriva next ov to symbicort  as her insurance won't cover breztri    Re SABA :  I spent extra time with pt today reviewing appropriate use of albuterol  for prn use on exertion with the following points: 1) saba is for relief of sob that does not improve by walking a slower pace or resting but rather if the pt does not improve after trying this first. 2) If the pt is convinced, as many are, that saba helps recover from activity faster then it's easy to tell if this is the case by re-challenging : ie stop, take the inhaler, then p 5 minutes try the exact same activity (intensity of workload) that just caused the symptoms and see if they are substantially diminished or not after saba 3) if there is an activity that reproducibly causes the symptoms, try the saba 15 min before the activity on alternate days   If in fact the saba really does help, then fine to continue to use it prn but advised may need to look closer at the maintenance regimen being used to achieve better control of airways disease with exertion.

## 2023-07-17 NOTE — Assessment & Plan Note (Addendum)
 Counseled re importance of smoking cessation but did not meet time criteria for separate billing    Low-dose CT lung cancer screening is recommended for patients who are 82-55 years of age with a 20+ pack-year history of smoking and who are currently smoking or quit <=15 years ago. No coughing up blood  No unintentional weight loss of > 15 pounds in the last 6 months - pt is eligible for scanning yearly until 24 y p quits > referred again today   Discussed in detail all the  indications, usual  risks and alternatives  relative to the benefits with patient who agrees to proceed with w/u as outlined.          F/u 3 m sooner if needed with inhalers in hand   Each maintenance medication was reviewed in detail including emphasizing most importantly the difference between maintenance and prns and under what circumstances the prns are to be triggered using an action plan format where appropriate.  Total time for H and P, chart review, counseling, reviewing hfa device(s) and generating customized AVS unique to this office visit / same day charting = 30 min

## 2023-07-21 NOTE — Progress Notes (Deleted)
 Lindsay Mcmillan, female    DOB: [birth year]    MRN: [id number]   Brief patient profile:  56 yowf  active smoker  GOLD 2 copd criteria 11/17/19  referred to pulmonary clinic in [facility]  03/25/2023 by Lindsay Ahle DO  for freq bronchitis and doe x 2014 and worse since rx trelegy > no better.     History of Present Illness  03/25/2023  Pulmonary/ 1st office eval/ Lindsay Mcmillan / [place] [facility] on trelegy 100 and lisinopril  40  Chief Complaint  Patient presents with   Establish Care   Shortness of Breath  Dyspnea:  walking x 100 ft no better on trelegy (? Helped at first)  Cough: p sev hours asleep assoc with loud wheezing  Sleep: 2 pillws flat freq cough esp in am > clear  SABA use: last used it 2-3 weeks  02: none  Lung cancer screen: referred today  Rec Stop trelegy and lisinopril   Olmesartan  40 mg one daily  Continue omeprazole  Take 30-60 min before first meal of the day and add pepcid  otc 20 mg at bedtime until night time cough and wheeze are gone  Plan A = Automatic = Always=    Breztri  Take 2 puffs first thing in am and then another 2 puffs about 12 hours later.  Work on inhaler technique:  Plan B = Backup (to supplement plan A, not to replace it) Only use your albuterol  inhaler as a rescue medication  My office will be contacting you by phone for referral to lung cancer screening  [phone]  - did not do as of 07/16/2023  - if you don't hear back from my office within one week please call us  back or notify us  thru MyChart and we'll address it right away.  Please schedule a follow up office visit in 4 weeks, sooner if needed -bring inhalers    07/16/2023  f/u ov/Lindsay Mcmillan office/Lindsay Mcmillan re: GOLD 2 copd /? Acei case  maint on symbicort  160  did not  bring inhalers   Chief Complaint  Patient presents with   COPD  Dyspnea:  very inactive at home/ agoraphobia  Cough: better  Sleeping: no resp cc p saba qh    resp cc  SABA use: bid and at bedtime  02: none   Lung cancer screening:  referred    07/23/2023  f/u ov/[facility]/Lindsay Mcmillan re: *** maint on ***  No chief complaint on file.   Dyspnea:  *** Cough: *** Sleeping: ***   resp cc  SABA use: *** 02: ***  Lung cancer screening: ***   No obvious day to day or daytime variability or assoc excess/ purulent sputum or mucus plugs or hemoptysis or cp or chest tightness, subjective wheeze or overt sinus or hb symptoms.    Also denies any obvious fluctuation of symptoms with weather or environmental changes or other aggravating or alleviating factors except as outlined above   No unusual exposure hx or h/o childhood pna/ asthma or knowledge of premature birth.  Current Allergies, Complete Past Medical History, Past Surgical History, Family History, and Social History were reviewed in [employer] record.  ROS  The following are not active complaints unless bolded Hoarseness, sore throat, dysphagia, dental problems, itching, sneezing,  nasal congestion or discharge of excess mucus or purulent secretions, ear ache,   fever, chills, sweats, unintended wt loss or wt gain, classically pleuritic or exertional cp,  orthopnea pnd or arm/hand swelling  or leg swelling, presyncope, palpitations, abdominal pain, anorexia,  nausea, vomiting, diarrhea  or change in bowel habits or change in bladder habits, change in stools or change in urine, dysuria, hematuria,  rash, arthralgias, visual complaints, headache, numbness, weakness or ataxia or problems with walking or coordination,  change in mood or  memory.        No outpatient medications have been marked as taking for the 07/23/23 encounter (Appointment) with Lindsay Ozell NOVAK, MD.                 Past Medical History:  Diagnosis Date   BOILS, RECURRENT 10/10/2009   Qualifier: Diagnosis of  By: Lindsay Mcmillan.    Chest pain in adult 09/13/2019   COPD (chronic obstructive pulmonary disease) (HCC)    Encounter for screening mammogram for  malignant neoplasm of breast 08/11/2019   Inguinal pain, right 12/09/2020   Substance abuse (HCC)    Tobacco dependence    Wound dehiscence       Objective:    wts   07/23/2023         ***   07/16/23 201 lb (91.2 kg)  06/18/23 209 lb 3.2 oz (94.9 kg)  03/27/23 198 lb (89.8 kg)    Vital signs reviewed  07/23/2023  - Note at rest 02 sats  ***% on ***   General appearance:    ***       Min barr ***            Assessment

## 2023-07-23 ENCOUNTER — Ambulatory Visit: Payer: MEDICAID | Admitting: Internal Medicine

## 2023-07-30 NOTE — Progress Notes (Deleted)
**Note Lindsay-Identified via Obfuscation** Referring Physician:  Tommie Sams, DO [address] [place],  [place] [address]  Primary Physician:  Lindsay Sams, DO  History of Present Illness: 07/30/2023 Ms. Lindsay Mcmillan is here today with a chief complaint of ***  Low back pain  Duration: *** Location: *** Quality: *** Severity: ***  Precipitating: aggravated by *** Modifying factors: made better by *** Weakness: none Timing: *** Bowel/Bladder Dysfunction: none  Conservative measures:  Physical therapy: has not participated in PT  Multimodal medical therapy including regular antiinflammatories: Tylenol, Flexeril,   Injections: no epidural steroid injections  Past Surgery: none  Lindsay Mcmillan has ***no symptoms of cervical myelopathy.  The symptoms are causing a significant impact on the patient's life.   Review of Systems:  A 10 point review of systems is negative, except for the pertinent positives and negatives detailed in the HPI.  Past Medical History: Past Medical History:  Diagnosis Date   BOILS, RECURRENT 10/10/2009   Qualifier: Diagnosis of  By: Lindsay Mcmillan.    Chest pain in adult 09/13/2019   COPD (chronic obstructive pulmonary disease) (HCC)    Encounter for screening mammogram for malignant neoplasm of breast 08/11/2019   Inguinal pain, right 12/09/2020   Substance abuse (HCC)    Tobacco dependence    Wound dehiscence     Past Surgical History: Past Surgical History:  Procedure Laterality Date   ABDOMINAL HYSTERECTOMY     CESAREAN SECTION      Allergies: Allergies as of 07/31/2023 - Review Complete 07/16/2023  Allergen Reaction Noted   Dalbavancin Anaphylaxis 03/07/2022   Vancomycin Hives 09/02/2022    Medications: Outpatient Encounter Medications as of 07/31/2023  Medication Sig   acetaminophen (TYLENOL) 500 MG tablet Take 1,000 mg by mouth every 6 (six) hours as needed for mild pain or headache.   albuterol (PROVENTIL) (2.5 MG/3ML) 0.083% nebulizer solution  Take 3 mLs (2.5 mg total) by nebulization every 4 (four) hours as needed.   albuterol (VENTOLIN HFA) 108 (90 Base) MCG/ACT inhaler Up to 2 puffs every 4 hours as needed   budesonide-formoterol (SYMBICORT) 160-4.5 MCG/ACT inhaler Inhale 2 puffs into the lungs 2 (two) times daily.   cyclobenzaprine (FLEXERIL) 10 MG tablet Take 1 tablet (10 mg total) by mouth at bedtime. One tablet every night at bedtime as needed for spasm.   furosemide (LASIX) 20 MG tablet Take 1 tablet (20 mg total) by mouth daily as needed for edema. (Patient not taking: Reported on 07/16/2023)   hydrochlorothiazide (HYDRODIURIL) 25 MG tablet Take 1 tablet (25 mg total) by mouth daily.   meclizine (ANTIVERT) 25 MG tablet Take 1 tablet (25 mg total) by mouth 2 (two) times daily as needed for dizziness.   olmesartan (BENICAR) 40 MG tablet Take 1 tablet (40 mg total) by mouth daily.   omeprazole (PRILOSEC) 40 MG capsule Take 1 capsule (40 mg total) by mouth daily.   ondansetron (ZOFRAN-ODT) 4 MG disintegrating tablet Take 1 tablet (4 mg total) by mouth every 8 (eight) hours as needed for nausea or vomiting.   No facility-administered encounter medications on file as of 07/31/2023.    Social History: Social History   Tobacco Use   Smoking status: Every Day    Current packs/day: 2.00    Average packs/day: 2.0 packs/day for 30.0 years (60.0 ttl pk-yrs)    Types: Cigarettes   Smokeless tobacco: Never  Vaping Use   Vaping status: Former  Substance Use Topics   Alcohol use: No  Drug use: Yes    Types: Cocaine    Comment: fentanyl    Family Medical History: Family History  Problem Relation Age of Onset   Diabetes Other    Cancer Other     Physical Examination: @VITALWITHPAIN @  General: Patient is well developed, well nourished, calm, collected, and in no apparent distress. Attention to examination is appropriate.  Psychiatric: Patient is non-anxious.  Head:  Pupils equal, round, and reactive to light.  ENT:  Oral  mucosa appears well hydrated.  Neck:   Supple.  ***Full range of motion.  Respiratory: Patient is breathing without any difficulty.  Extremities: No edema.  Vascular: Palpable dorsal pedal pulses.  Skin:   On exposed skin, there are no abnormal skin lesions.  NEUROLOGICAL:     Awake, alert, oriented to person, place, and time.  Speech is clear and fluent. Fund of knowledge is appropriate.   Cranial Nerves: Pupils equal round and reactive to light.  Facial tone is symmetric.  Facial sensation is symmetric.  ROM of spine: ***full.  Palpation of spine: ***non tender.    Strength: Side Biceps Triceps Deltoid Interossei Grip Wrist Ext. Wrist Flex.  R 5 5 5 5 5 5 5   L 5 5 5 5 5 5 5    Side Iliopsoas Quads Hamstring PF DF EHL  R 5 5 5 5 5 5   L 5 5 5 5 5 5    Reflexes are ***2+ and symmetric at the biceps, triceps, brachioradialis, patella and achilles.   Hoffman's is absent.  Clonus is not present.  Toes are down-going.  Bilateral upper and lower extremity sensation is intact to light touch.    Gait is normal.   No difficulty with tandem gait.   No evidence of dysmetria noted.  Medical Decision Making  Imaging: ***  I have personally reviewed the images and agree with the above interpretation.  Assessment and Plan: Lindsay Mcmillan is a pleasant 53 y.o. female with ***    Thank you for involving me in the care of this patient.   I spent a total of *** minutes in both face-to-face and non-face-to-face activities for this visit on the date of this encounter.   Lindsay Flores, PA-C Dept. of Neurosurgery

## 2023-07-31 ENCOUNTER — Ambulatory Visit: Payer: MEDICAID | Admitting: Physician Assistant

## 2023-08-06 NOTE — Progress Notes (Unsigned)
Referring Physician:  Tommie Sams, DO [address] [place],  [place] [address]  Primary Physician:  Lindsay Sams, DO  History of Present Illness: 08/07/2023 Ms. Lindsay Mcmillan is here today with a chief complaint of acute on chronic back pain increasing in severity over the last 8 months.  She states she is in constant pain nonstop and it radiates down the back of her legs to the back of her knees.  She states she constantly has to lean forward and hold onto something to help relieve her symptoms.  She has cramping and pain in her calves that is worse with walking.  She adds that sometimes she does have pain that radiates to the bottom of her foot of her left lower extremity although that is more rare.  This is continued despite Tylenol, Flexeril.  She denies weakness.  She denies any saddle anesthesia or incontinence to bowel or bladder.   Conservative measures:  Physical therapy: has not participated in PT  Multimodal medical therapy including regular antiinflammatories: Tylenol, Flexeril,   Injections: no epidural steroid injections  Past Surgery: none  Lindsay Mcmillan has no symptoms of cervical myelopathy.  Current everyday smoker  The symptoms are causing a significant impact on the patient's life.   Review of Systems:  A 10 point review of systems is negative, except for the pertinent positives and negatives detailed in the HPI.  Past Medical History: Past Medical History:  Diagnosis Date   BOILS, RECURRENT 10/10/2009   Qualifier: Diagnosis of  By: De Blanch.    Chest pain in adult 09/13/2019   COPD (chronic obstructive pulmonary disease) (HCC)    Encounter for screening mammogram for malignant neoplasm of breast 08/11/2019   Inguinal pain, right 12/09/2020   Substance abuse (HCC)    Tobacco dependence    Wound dehiscence     Past Surgical History: Past Surgical History:  Procedure Laterality Date   ABDOMINAL HYSTERECTOMY     CESAREAN  SECTION      Allergies: Allergies as of 08/07/2023 - Review Complete 08/07/2023  Allergen Reaction Noted   Dalbavancin Anaphylaxis 03/07/2022   Vancomycin Hives 09/02/2022    Medications: Outpatient Encounter Medications as of 08/07/2023  Medication Sig   acetaminophen (TYLENOL) 500 MG tablet Take 1,000 mg by mouth every 6 (six) hours as needed for mild pain or headache.   albuterol (PROVENTIL) (2.5 MG/3ML) 0.083% nebulizer solution Take 3 mLs (2.5 mg total) by nebulization every 4 (four) hours as needed.   albuterol (VENTOLIN HFA) 108 (90 Base) MCG/ACT inhaler Up to 2 puffs every 4 hours as needed   celecoxib (CELEBREX) 200 MG capsule Take 1 capsule (200 mg total) by mouth 2 (two) times daily.   cyclobenzaprine (FLEXERIL) 10 MG tablet Take 1 tablet (10 mg total) by mouth at bedtime. One tablet every night at bedtime as needed for spasm.   hydrochlorothiazide (HYDRODIURIL) 25 MG tablet Take 1 tablet (25 mg total) by mouth daily.   meclizine (ANTIVERT) 25 MG tablet Take 1 tablet (25 mg total) by mouth 2 (two) times daily as needed for dizziness.   meloxicam (MOBIC) 15 MG tablet Take 15 mg by mouth daily.   olmesartan (BENICAR) 40 MG tablet Take 1 tablet (40 mg total) by mouth daily.   omeprazole (PRILOSEC) 40 MG capsule Take 1 capsule (40 mg total) by mouth daily.   ondansetron (ZOFRAN-ODT) 4 MG disintegrating tablet Take 1 tablet (4 mg total) by mouth every 8 (eight)  hours as needed for nausea or vomiting.   budesonide-formoterol (SYMBICORT) 160-4.5 MCG/ACT inhaler Inhale 2 puffs into the lungs 2 (two) times daily.   [DISCONTINUED] furosemide (LASIX) 20 MG tablet Take 1 tablet (20 mg total) by mouth daily as needed for edema. (Patient not taking: Reported on 07/16/2023)   No facility-administered encounter medications on file as of 08/07/2023.    Social History: Social History   Tobacco Use   Smoking status: Every Day    Current packs/day: 2.00    Average packs/day: 2.0 packs/day for  30.0 years (60.0 ttl pk-yrs)    Types: Cigarettes   Smokeless tobacco: Never  Vaping Use   Vaping status: Former  Substance Use Topics   Alcohol use: No   Drug use: Yes    Types: Cocaine    Comment: fentanyl    Family Medical History: Family History  Problem Relation Age of Onset   Diabetes Other    Cancer Other     Physical Examination: @VITALWITHPAIN @  General: Patient is well developed, well nourished, calm, collected, and in no apparent distress. Attention to examination is appropriate.  Psychiatric: Patient is non-anxious.  Head:  Pupils equal, round, and reactive to light.  ENT:  Oral mucosa appears well hydrated.  Neck:   Supple.  Full range of motion.  Respiratory: Patient is breathing without any difficulty.  Extremities: No edema.  Vascular: Palpable dorsal pedal pulses.  Skin:   On exposed skin, there are no abnormal skin lesions.  NEUROLOGICAL:     Awake, alert, oriented to person, place, and time.  Speech is clear and fluent. Fund of knowledge is appropriate.   Cranial Nerves: Pupils equal round and reactive to light.  Facial tone is symmetric.  Facial sensation is symmetric.  ROM of spine: Mild tenderness to palpation of her lumbar paraspinals   Strength:   Somewhat difficult exam secondary to giveaway weakness and tremor.  4+-5/5 throughout bilateral lower extremities  Reflexes are 2-3+ in patella and hamstring.  1+ Achilles  Clonus is not present.  Toes are down-going.  Bilateral upper and lower extremity sensation is intact to light touch.    Gait is normal.   No difficulty with tandem gait.   No evidence of dysmetria noted.  Medical Decision Making  Imaging: MRI Lumbar spine (05/29/23):  IMPRESSION: 1. At L4-L5, severe canal stenosis with moderate bilateral foraminal stenosis. 2. At L3-L4, mild canal and bilateral subarticular recess stenosis and mild bilateral foraminal stenosis 3. At L2-L3, mild bilateral foraminal stenosis  and mild subarticular recess stenosis.  I have personally reviewed the images and agree with the above interpretation.  Assessment and Plan: Ms. [name] is a pleasant 53 y.o. female is here today with a chief complaint of acute on chronic back pain increasing in severity over the last 8 months.  She states she is in constant pain nonstop and it radiates down the back of her legs to the back of her knees.  She states she constantly has to lean forward and hold onto something to help relieve her symptoms.  She has cramping and pain in her calves that is worse with walking.  She adds that sometimes she does have pain that radiates to the bottom of her foot of her left lower extremity although that is more rare.  This is continued despite Tylenol, Flexeril.  She denies weakness.  She denies any saddle anesthesia or incontinence to bowel or bladder.  On examination mild tenderness to palpation of her lumbar paraspinals strength  exam was somewhat difficult exam secondary to giveaway weakness and tremor, but largely 4+-5/5 throughout bilateral lower extremities.  MRI lumbar spine reviewed which does show severe central canal stenosis at L4-5 with moderate bilateral foraminal narrowing.  Pleasure to see patient in clinic today.  I do believe that patient is suffering from neurogenic claudication secondary to central canal stenosis.  We would like her to undergo physical therapy, but do anticipate likely for her to undergo surgery due to severe claudication symptoms and pain with associated significant stenosis.  Plan for follow-up in approximately 6 weeks.   Thank you for involving me in the care of this patient.   Joan Flores, PA-C Dept. of Neurosurgery

## 2023-08-07 ENCOUNTER — Encounter: Payer: Self-pay | Admitting: Physician Assistant

## 2023-08-07 ENCOUNTER — Ambulatory Visit (INDEPENDENT_AMBULATORY_CARE_PROVIDER_SITE_OTHER): Payer: MEDICAID | Admitting: Physician Assistant

## 2023-08-07 VITALS — BP 178/102 | Ht 68.0 in | Wt 202.0 lb

## 2023-08-07 DIAGNOSIS — M48062 Spinal stenosis, lumbar region with neurogenic claudication: Secondary | ICD-10-CM

## 2023-08-07 MED ORDER — CELECOXIB 200 MG PO CAPS: 200.0000 mg | ORAL_CAPSULE | Freq: Two times a day (BID) | ORAL | 1 refills | Status: DC

## 2023-08-07 NOTE — Patient Instructions (Signed)
LOCAL PHYSICAL THERAPY  [facility]  [address]  [place], [place] [address]  [phone]  [facility]  [address] [place], [place] [address]  [phone]  [facility] (2 locations)  [address]  [place], [place] [address]  [phone]          or  [address]  [place], [place] [address]  [phone]  [facility]  [address]  [place], [place] [address]  [phone]  **dry needling**  [facility]  [address]  [place], [place] [address]  [phone]  Fax: [phone]  ** Aquatic therapy[address] [place], [place] [address] [phone] **Aquatic therapy**  [facility]  [facility]  [address]  [place], [place] [address]  [phone]  [facility]  [address]  [place], [place] [address]  [phone]  [facility]  [address]   Janora Norlander  [place], [place] [address]  [phone]  [facility]  [address]  [place], [place] [address]  [phone]  **dry needling**   PELVIC FLOOR/SI JOINT  [facility]  Mariane Masters, PT  [email or web address]   [facility]  [address]  [place], [place] [address]  [phone]   [facility] [place]  [address], [place] [address]  [phone]   [facility], [place]  [facility]  Raymond Gurney, PT  [address]  [place], [place] [address] [phone]   Samara Deist  [facility]  [address]  [phone]   [facility]  [address]  [phone]   [facility]  Annamaria Boots [facility]  [address]  [place], [place] [address]  [phone]   **No Aetna or medicaid**  [facility]  [phone] [address] [place] [address]  [phone]  [facility]  [address]  [place], [place] [address]  [phone]  Dannial Monarch, DC  [address], [place] [address]  [phone]  [facility]  [address], [place] [address]  [phone]  [facility]  [address]  [place], [place] [address]  [phone]  [facility]  (3 locations)  [address]  [place], [place] [address]  [phone]  **dry needling**           or  [address] [place], [place] [address]  [phone]  **Additionally has Gloris [place], OT**           or  [address]  [place], [place] [address]  [phone]  **Pediatric therapy**  [facility]  [address]  [phone]  **dry needlingVerdie Drown [facility]  [address]  [place], [place] [address]  [phone]  [facility]   (Inside [address] [facility]itness)  [address]  [place], [place] [address]  [phone]  **dry needling**  **MEDICAID or UNINSURED** [facility] dept. Of Physical Therapy [place], [place] [address] [phone]  [facility]  [address] [place], [place] [address]  [phone]   [facility]  [address]  [place], [place] [address]  [phone]   Doreatha Martin  [facility]  [address] [place], [place] [address]  [phone]   [facility]  [address]  [place], [place] [address]  [phone]   [facility]  [address] [place], [place] [address]  [phone]  [facility]  [address]  [place], [place] [address]  [phone]  AQUATIC  [facility]  [employer] [facility]  [facility]  [place]  *Residents only*   [facility] [employer]  *[facility]  Exercise class  [facility]  Exercise class  [facility]  [address]  [place], [place]   [phone]  [facility]  [address]  [place], [place] [address]  [phone]   [place], [place]  [facility]  [address]  [phone]   [facility]  [facility]  [address]  [phone]           or  [address]  [phone]   [facility]   Provides education and support and practical information for coping with arthritis for arthritis sufferers and their families.   When: 12:15 - 1:30 p.m. the second Monday of each month, March through December  Info: Call Rehabilitation Services at [phone]

## 2023-08-08 ENCOUNTER — Telehealth: Payer: Self-pay | Admitting: Family Medicine

## 2023-08-08 NOTE — Telephone Encounter (Signed)
Spoke with patient and she states she has been taking hydrochlorothiazide 50 mg bid up to this point, this had been prescribed by her previous doctor, and just picked up hydrochlorothiazide 25 mg and wanted to know what amount she should be taking , please advise.

## 2023-08-08 NOTE — Telephone Encounter (Signed)
Copied from CRM [id number]. Topic: Clinical - Medication Question >> Aug 08, 2023  4:08 PM Alvino Blood C wrote: Reason for CRM: Patient is wondering why her script dosage was lowered for the following medication: hydrochlorothiazide (HYDRODIURIL) 25 MG tablet. Please give the patient a call back at [phone] (M)

## 2023-08-09 NOTE — Telephone Encounter (Signed)
Pt was informed of the hydrochlorothiazide dose recommendations of 25 mg per day, she states she couldn't get her blood drawn because of an outstanding past due balance at [facility] but will as soon as she can get some money saved up.

## 2023-08-19 ENCOUNTER — Ambulatory Visit (HOSPITAL_COMMUNITY): Payer: MEDICAID | Attending: Physician Assistant

## 2023-08-19 ENCOUNTER — Encounter (HOSPITAL_COMMUNITY): Payer: Self-pay

## 2023-08-19 ENCOUNTER — Other Ambulatory Visit: Payer: Self-pay

## 2023-08-19 DIAGNOSIS — M6281 Muscle weakness (generalized): Secondary | ICD-10-CM | POA: Diagnosis present

## 2023-08-19 DIAGNOSIS — M48062 Spinal stenosis, lumbar region with neurogenic claudication: Secondary | ICD-10-CM

## 2023-08-19 NOTE — Therapy (Signed)
 OUTPATIENT PHYSICAL THERAPY THORACOLUMBAR EVALUATION   Patient Name: Lindsay Mcmillan MRN: [id number] DOB:[birth year], 53 y.o., female Today's Date: 08/19/2023  END OF SESSION:  PT End of Session - 08/19/23 1504     Visit Number 1    Date for PT Re-Evaluation 09/30/23    Authorization Type [employer]    Authorization Time Period seeking new auth    Progress Note Due on Visit 10    PT Start Time 1403    PT Stop Time 1501    PT Time Calculation (min) 58 min    Activity Tolerance Patient tolerated treatment well    Behavior During Therapy [facility] for tasks assessed/performed             Past Medical History:  Diagnosis Date   BOILS, RECURRENT 10/10/2009   Qualifier: Diagnosis of  By: Artelia Laroche    Chest pain in adult 09/13/2019   COPD (chronic obstructive pulmonary disease) (HCC)    Encounter for screening mammogram for malignant neoplasm of breast 08/11/2019   Inguinal pain, right 12/09/2020   Substance abuse (HCC)    Tobacco dependence    Wound dehiscence    Past Surgical History:  Procedure Laterality Date   ABDOMINAL HYSTERECTOMY     CESAREAN SECTION     Patient Active Problem List   Diagnosis Date Noted   Cigarette smoker 03/25/2023   History of substance abuse (HCC) 01/06/2023   Chronic bilateral low back pain 01/06/2023   Excessive daytime sleepiness 01/04/2023   Jerking movements of extremities 01/04/2023   Stimulant use disorder 07/06/2022   Essential hypertension 04/20/2020   Depression 04/20/2020   Snoring 04/20/2020   COPD GOLD  2 08/11/2019    PCP: Kathleen Papa, Crissie Dome, DO  REFERRING PROVIDER: Ludwig Safer, PA-C  REFERRING DIAG: [phone] (ICD-10-CM) - Spinal stenosis, lumbar region, with neurogenic claudication   Rationale for Evaluation and Treatment: Rehabilitation  THERAPY DIAG:  Lumbar stenosis with neurogenic claudication  Muscle weakness (generalized)  ONSET DATE: ~ 10 years  SUBJECTIVE:                                                                                                                                                                                            SUBJECTIVE STATEMENT: Pt reporting mild low back pain started roughly 10 years ago. Everyday 'bad' pain started about 8 months ago. Pt reports pain just a top of bottom and goes down the back of the legs and into knees. Pt reports sometimes limitations in walking due to pain. Pt reports assist with bed mobility. Pt reports pinching sensation. Can't bend backwards at all.  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 Pt reports use of suboxan for pain medication management. Pt is a slide sleeper.   Observed: sitting in chair with constant support with BUE on chair or side.  PERTINENT HISTORY:  See above.   PAIN:  Are you having pain? Yes: NPRS scale: 6/10 Pain location: Low back into proximal bottom Pain description: pinching Aggravating factors: standing, walking, and lifting Relieving factors: leaning over  PRECAUTIONS: None  RED FLAGS: None   WEIGHT BEARING RESTRICTIONS: No  FALLS:  Has patient fallen in last 6 months? No   PATIENT GOALS: "get out of pain"  NEXT MD VISIT: 6 weeks  OBJECTIVE:  Note: Objective measures were completed at Evaluation unless otherwise noted.  DIAGNOSTIC FINDINGS:  Imaging: MRI Lumbar spine (05/29/23):   IMPRESSION: 1. At L4-L5, severe canal stenosis with moderate bilateral foraminal stenosis. 2. At L3-L4, mild canal and bilateral subarticular recess stenosis and mild bilateral foraminal stenosis 3. At L2-L3, mild bilateral foraminal stenosis and mild subarticular recess stenosis.  PATIENT SURVEYS:  Modified Oswestry 34/50 = 68%   COGNITION: Overall cognitive status: Within functional limits for tasks assessed     SENSATION: WFL   POSTURE: rounded shoulders, forward head, and increased lumbar lordosis  PALPATION: Tender to  palpate bilateral gluteal medius musculature. Palpable nodule at Left rib cage.   LUMBAR ROM:   AROM eval  Flexion 90%  Extension 15%  Right lateral flexion 50%  Left lateral flexion 50%  Right rotation   Left rotation    (Blank rows = not tested)  LOWER EXTREMITY ROM:     Active  Right eval Left eval  Hip flexion    Hip extension    Hip abduction    Hip adduction    Hip internal rotation    Hip external rotation    Knee flexion    Knee extension    Ankle dorsiflexion    Ankle plantarflexion    Ankle inversion    Ankle eversion     (Blank rows = not tested) *positive thomas test bilaterally  LOWER EXTREMITY MMT:    MMT Right eval Left eval  Hip flexion 3+ 3+  Hip extension 2+ 2+  Hip abduction 3+ 3+  Hip adduction    Hip internal rotation    Hip external rotation    Knee flexion 3+ 3+  Knee extension 4- 4-  Ankle dorsiflexion    Ankle plantarflexion    Ankle inversion    Ankle eversion     (Blank rows = not tested)  FUNCTIONAL TESTS:  30 seconds chair stand test: 9x- burning at bilateral PSIS 2 minute walk test: 211ft  GAIT: Distance walked: 290ft Assistive device utilized: None Level of assistance: Complete Independence Comments: Antalgic with reduced R step length vs Left, limited bilateral arm swing.   TREATMENT DATE:  08/19/2023 PT Evaluation  Posterior pelvic tilt x 10  Sidelying clamshells Prone pressups on to elbow x 2' Prone quadriceps stretch 1 x 1'  PATIENT EDUCATION:  Education details: PT Evaluation, findings, prognosis, frequency, attendance policy, and HEP if given.  Person educated: Patient Education method: Medical illustrator Education comprehension: verbalized understanding  HOME EXERCISE PROGRAM: Access Code: [email or web address] URL: [email or web address] Date: 08/19/2023 Prepared by:  Irene Mannheim  Exercises - Supine Posterior Pelvic Tilt  - 1 x daily - 7 x weekly - 3 sets - 20 reps - Supine Lower Trunk Rotation  - 1 x daily - 7 x weekly - 3 sets - 30 reps - Clamshell  - 1 x daily - 7 x weekly - 3 sets - 12-15 reps - 3 hold - Prone Press Up On Elbows  - 1 x daily - 7 x weekly - 3 sets - 10 reps - Prone Quadriceps Stretch with Strap  - 1 x daily - 7 x weekly - 3 sets - 2-3 reps - 60 hold  ASSESSMENT:  CLINICAL IMPRESSION: Patient is a 53 y.o. female who was seen today for physical therapy evaluation and treatment for M48.062 (ICD-10-CM) - Spinal stenosis, lumbar region, with neurogenic claudication. Pt with 8 months of ongoing severe pain. Pt demonstrating significant limitations in functional mobility, activity tolerance, ADLs, community access due to BLE muscle weakness, Lumbar ROM deficits, postural deficits and chronic pain. Pt will benefit from skilled Physical Therapy services to address deficits/limitations in order to improve functional and QOL.    OBJECTIVE IMPAIRMENTS: Abnormal gait, decreased activity tolerance, decreased balance, decreased mobility, difficulty walking, decreased ROM, decreased strength, hypomobility, and pain.   ACTIVITY LIMITATIONS: carrying, lifting, bending, sitting, standing, squatting, sleeping, transfers, bed mobility, bathing, toileting, and dressing  PARTICIPATION LIMITATIONS: laundry, driving, shopping, community activity, occupation, and yard work  PERSONAL FACTORS: Age and 3+ comorbidities: see above for details  are also affecting patient's functional outcome.   REHAB POTENTIAL: Good  CLINICAL DECISION MAKING: Evolving/moderate complexity  EVALUATION COMPLEXITY: Moderate   GOALS: Goals reviewed with patient? No  SHORT TERM GOALS: Target date: 09/09/2023  Pt will be independent with HEP in order to demonstrate participation in Physical Therapy POC.  Baseline: Goal status: INITIAL  2.  Pt will report 4/10 pain with  mobility in order to demonstrate improved pain with functional activities.  Baseline:  Goal status: INITIAL  LONG TERM GOALS: Target date: 09/30/2023  Pt will improve 30 Second Chair Stand Test by >/ 2  in order to demonstrate improved functional strength to return to desired activities.  Baseline:See objective.  Goal status: INITIAL  2.  Pt will improve 2 MWT by 134ft in order to demonstrate improved functional ambulatory capacity in community setting.  Baseline: See objective.  Goal status: INITIAL  3.  Pt will improve Modified Oswestry score by 10 points in order to demonstrate improved pain with functional goals and outcomes. Baseline: See objective.  Goal status: INITIAL  4.  Pt will report 2/10 pain with mobility in order to demonstrate reduced pain with overall functional status.  Baseline: See objective.  Goal status: INITIAL  PLAN:  PT FREQUENCY: 2x/week  PT DURATION: 6 weeks  PLANNED INTERVENTIONS: 97164- PT Re-evaluation, 97110-Therapeutic exercises, [address]- Therapeutic activity, [address]- Neuromuscular re-education, [address]- Self Care, [address]- Manual therapy, [id number]- Gait training, [address]- Electrical stimulation (unattended), [id number]- Electrical stimulation (manual), [id number]- Traction (mechanical), Patient/Family education, Balance training, Stair training, Taping, Dry Needling, Joint mobilization, Joint manipulation, Spinal manipulation, Spinal mobilization, Cryotherapy, and Moist heat.  PLAN FOR  NEXT SESSION: gentle Lumbar ROM, BLE strengthening  Astrid Lay, DPT [facility] [place] [phone] office  For all possible CPT codes, reference the Planned Interventions line above.     Check all conditions that are expected to impact treatment: {Conditions expected to impact treatment:Unknown   If treatment provided at initial evaluation, no treatment charged due to lack of authorization.        Gatha Kaska, PT 08/19/2023, 3:12  PM

## 2023-08-26 ENCOUNTER — Ambulatory Visit (HOSPITAL_COMMUNITY): Payer: MEDICAID

## 2023-09-04 ENCOUNTER — Encounter (HOSPITAL_COMMUNITY): Payer: MEDICAID | Admitting: Physical Therapy

## 2023-09-06 ENCOUNTER — Ambulatory Visit (HOSPITAL_COMMUNITY): Payer: MEDICAID

## 2023-09-06 ENCOUNTER — Encounter (HOSPITAL_COMMUNITY): Payer: Self-pay

## 2023-09-06 DIAGNOSIS — M6281 Muscle weakness (generalized): Secondary | ICD-10-CM

## 2023-09-06 DIAGNOSIS — M48062 Spinal stenosis, lumbar region with neurogenic claudication: Secondary | ICD-10-CM

## 2023-09-06 NOTE — Therapy (Addendum)
 OUTPATIENT PHYSICAL THERAPY THORACOLUMBAR EVALUATION   Patient Name: Lindsay Mcmillan MRN: [id number] DOB:September 23, 1970, 53 y.o., female Today's Date: 09/06/2023  END OF SESSION:  PT End of Session - 09/06/23 1433     Visit Number 2    Date for PT Re-Evaluation 09/30/23    Authorization Type [employer]    Authorization Time Period 8v from 08/19/23-02/15/24    Authorization - Visit Number 2    Authorization - Number of Visits 8    Progress Note Due on Visit 8    PT Start Time 1434    PT Stop Time 1515    PT Time Calculation (min) 41 min    Activity Tolerance Patient tolerated treatment well;Patient limited by pain    Behavior During Therapy [facility] for tasks assessed/performed             Past Medical History:  Diagnosis Date   BOILS, RECURRENT 10/10/2009   Qualifier: Diagnosis of  By: De Blanch.    Chest pain in adult 09/13/2019   COPD (chronic obstructive pulmonary disease) (HCC)    Encounter for screening mammogram for malignant neoplasm of breast 08/11/2019   Inguinal pain, right 12/09/2020   Substance abuse (HCC)    Tobacco dependence    Wound dehiscence    Past Surgical History:  Procedure Laterality Date   ABDOMINAL HYSTERECTOMY     CESAREAN SECTION     Patient Active Problem List   Diagnosis Date Noted   Cigarette smoker 03/25/2023   History of substance abuse (HCC) 01/06/2023   Chronic bilateral low back pain 01/06/2023   Excessive daytime sleepiness 01/04/2023   Jerking movements of extremities 01/04/2023   Stimulant use disorder 07/06/2022   Essential hypertension 04/20/2020   Depression 04/20/2020   Snoring 04/20/2020   COPD GOLD  2 08/11/2019    PCP: Everlene Other, Reece Agar, DO  REFERRING PROVIDER: Joan Flores, PA-C  REFERRING DIAG: [phone] (ICD-10-CM) - Spinal stenosis, lumbar region, with neurogenic claudication   Rationale for Evaluation and Treatment: Rehabilitation  THERAPY DIAG:  Lumbar stenosis with neurogenic  claudication  Muscle weakness (generalized)  ONSET DATE: ~ 10 years  SUBJECTIVE:                                                                                                                                                                                           SUBJECTIVE STATEMENT: 09/06/23: Pt has been hurting pretty bad lately and reports "almost going to the hospital." Pt reports having some new pain at gall bladder region and now has bilateral rib pain twisting and picking up purse from back seat of car. Pain in  ribs started about 8 months ago on the left side and right side about 2 years, started as she started working at [employer] . Pt reports only sleeping about 2 hours a night and naps throughout the day. Used to work nights. Pt states pain starts at top of buttocks to R knee and Left foot. Pt does them occasionally but could not complete them fully due to pain. Pt voices concerns for current relationship several times throughout the session and states she feels this could be complicating her recovery.   Eval: Pt reporting mild low back pain started roughly 10 years ago. Everyday 'bad' pain started about 8 months ago. Pt reports pain just a top of bottom and goes down the back of the legs and into knees. Pt reports sometimes limitations in walking due to pain. Pt reports assist with bed mobility. Pt reports pinching sensation. Can't bend backwards at all. 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 Pt reports use of suboxan for pain medication management. Pt is a slide sleeper.   Observed: sitting in chair with constant support with BUE on chair or side.  PERTINENT HISTORY:  See above.   PAIN:  Are you having pain? Yes: NPRS scale: 6/10 Pain location: Low back into proximal bottom Pain description: pinching Aggravating factors: standing, walking, and lifting Relieving factors: leaning over 09/06/2023 6/10 reported (pt reports  pain is normally worse in the morning) PRECAUTIONS: None  RED FLAGS: None   WEIGHT BEARING RESTRICTIONS: No  FALLS:  Has patient fallen in last 6 months? No   PATIENT GOALS: "get out of pain"  NEXT MD VISIT: 6 weeks  OBJECTIVE:  Note: Objective measures were completed at Evaluation unless otherwise noted.  DIAGNOSTIC FINDINGS:  Imaging: MRI Lumbar spine (05/29/23):   IMPRESSION: 1. At L4-L5, severe canal stenosis with moderate bilateral foraminal stenosis. 2. At L3-L4, mild canal and bilateral subarticular recess stenosis and mild bilateral foraminal stenosis 3. At L2-L3, mild bilateral foraminal stenosis and mild subarticular recess stenosis.  PATIENT SURVEYS:  Modified Oswestry 34/50 = 68%   COGNITION: Overall cognitive status: Within functional limits for tasks assessed     SENSATION: WFL   POSTURE: rounded shoulders, forward head, and increased lumbar lordosis  PALPATION: Tender to palpate bilateral gluteal medius musculature. Palpable nodule at Left rib cage.   LUMBAR ROM:   AROM eval  Flexion 90%  Extension 15%  Right lateral flexion 50%  Left lateral flexion 50%  Right rotation   Left rotation    (Blank rows = not tested)  LOWER EXTREMITY ROM:     Active  Right eval Left eval  Hip flexion    Hip extension    Hip abduction    Hip adduction    Hip internal rotation    Hip external rotation    Knee flexion    Knee extension    Ankle dorsiflexion    Ankle plantarflexion    Ankle inversion    Ankle eversion     (Blank rows = not tested) *positive thomas test bilaterally  LOWER EXTREMITY MMT:    MMT Right eval Left eval  Hip flexion 3+ 3+  Hip extension 2+ 2+  Hip abduction 3+ 3+  Hip adduction    Hip internal rotation    Hip external rotation    Knee flexion 3+ 3+  Knee extension 4- 4-  Ankle dorsiflexion    Ankle plantarflexion    Ankle inversion  Ankle eversion     (Blank rows = not tested)  FUNCTIONAL TESTS:  30  seconds chair stand test: 9x- burning at bilateral PSIS 2 minute walk test: 243ft  GAIT: Distance walked: 217ft Assistive device utilized: None Level of assistance: Complete Independence Comments: Antalgic with reduced R step length vs Left, limited bilateral arm swing.   TREATMENT DATE: 09/06/2023  Therapeutic Exercise: -DKTC on green theraball, 2 sets of 15 reps -Standing core push downs on green physioball on plynth table, 5 reps 10 second holds (straight, right and left) -Banded walks (lateral steps and monster walks), 2 laps each, 40 feet per lap -Nustep 5 min, level 4, 65 spm  Neuromuscular Re-education:  -Supine PPT 1 set of 20 reps, 3 seconds, pt cued for avoiding end/painful range of motion -LTR, 2 sets of 10 reps bilaterally, pt cued for slow controlled motion -Seated pavlov press on green physioball 2 sets of 10 reps  08/19/2023 PT Evaluation                                                                                                                               Posterior pelvic tilt x 10  Sidelying clamshells Prone pressups on to elbow x 2' Prone quadriceps stretch 1 x 1'  PATIENT EDUCATION:  Education details: PT Evaluation, findings, prognosis, frequency, attendance policy, and HEP if given.  Person educated: Patient Education method: Medical illustrator Education comprehension: verbalized understanding  HOME EXERCISE PROGRAM: Access Code: [email or web address] URL: [email or web address] Date: 08/19/2023 Prepared by: Starling Manns  Exercises - Supine Posterior Pelvic Tilt  - 1 x daily - 7 x weekly - 3 sets - 20 reps - Supine Lower Trunk Rotation  - 1 x daily - 7 x weekly - 3 sets - 30 reps - Clamshell  - 1 x daily - 7 x weekly - 3 sets - 12-15 reps - 3 hold - Prone Press Up On Elbows  - 1 x daily - 7 x weekly - 3 sets - 10 reps - Prone Quadriceps Stretch with Strap  - 1 x daily - 7 x weekly - 3 sets - 2-3 reps - 60  hold  ASSESSMENT:  CLINICAL IMPRESSION: Pt demonstrates decreased ROM throughout session due to pain. Unable to achieve the prone position due to pt request. Patient would continue to benefit from skilled physical therapy for increased pain free lumbar ROM, improved quality of life, and continued progress towards therapy goals.   Patient is a 53 y.o. female who was seen today for physical therapy evaluation and treatment for M48.062 (ICD-10-CM) - Spinal stenosis, lumbar region, with neurogenic claudication. Pt with 8 months of ongoing severe pain. Pt demonstrating significant limitations in functional mobility, activity tolerance, ADLs, community access due to BLE muscle weakness, Lumbar ROM deficits, postural deficits and chronic pain. Pt will benefit from skilled Physical Therapy services to address deficits/limitations in order to improve functional and QOL.    OBJECTIVE IMPAIRMENTS: Abnormal gait,  decreased activity tolerance, decreased balance, decreased mobility, difficulty walking, decreased ROM, decreased strength, hypomobility, and pain.   ACTIVITY LIMITATIONS: carrying, lifting, bending, sitting, standing, squatting, sleeping, transfers, bed mobility, bathing, toileting, and dressing  PARTICIPATION LIMITATIONS: laundry, driving, shopping, community activity, occupation, and yard work  PERSONAL FACTORS: Age and 3+ comorbidities: see above for details  are also affecting patient's functional outcome.   REHAB POTENTIAL: Good  CLINICAL DECISION MAKING: Evolving/moderate complexity  EVALUATION COMPLEXITY: Moderate   GOALS: Goals reviewed with patient? No  SHORT TERM GOALS: Target date: 09/09/2023  Pt will be independent with HEP in order to demonstrate participation in Physical Therapy POC.  Baseline: Goal status: INITIAL  2.  Pt will report 4/10 pain with mobility in order to demonstrate improved pain with functional activities.  Baseline:  Goal status: INITIAL  LONG TERM  GOALS: Target date: 09/30/2023  Pt will improve 30 Second Chair Stand Test by >/ 2  in order to demonstrate improved functional strength to return to desired activities.  Baseline:See objective.  Goal status: INITIAL  2.  Pt will improve 2 MWT by 147ft in order to demonstrate improved functional ambulatory capacity in community setting.  Baseline: See objective.  Goal status: INITIAL  3.  Pt will improve Modified Oswestry score by 10 points in order to demonstrate improved pain with functional goals and outcomes. Baseline: See objective.  Goal status: INITIAL  4.  Pt will report 2/10 pain with mobility in order to demonstrate reduced pain with overall functional status.  Baseline: See objective.  Goal status: INITIAL  PLAN:  PT FREQUENCY: 2x/week  PT DURATION: 6 weeks  PLANNED INTERVENTIONS: 97164- PT Re-evaluation, 97110-Therapeutic exercises, 97530- Therapeutic activity, 97112- Neuromuscular re-education, 97535- Self Care, 52841- Manual therapy, [id number]- Gait training, 97014- Electrical stimulation (unattended), [id number]- Electrical stimulation (manual), [id number]- Traction (mechanical), Patient/Family education, Balance training, Stair training, Taping, Dry Needling, Joint mobilization, Joint manipulation, Spinal manipulation, Spinal mobilization, Cryotherapy, and Moist heat.  PLAN FOR NEXT SESSION: gentle Lumbar ROM, BLE strengthening  Elie Goody, DPT [facility] [place] [phone] office  For all possible CPT codes, reference the Planned Interventions line above.     Check all conditions that are expected to impact treatment: {Conditions expected to impact treatment:Unknown   If treatment provided at initial evaluation, no treatment charged due to lack of authorization.       Luz Lex, PT, DPT [facility] Office: [phone]

## 2023-09-10 ENCOUNTER — Encounter (HOSPITAL_COMMUNITY): Payer: MEDICAID | Admitting: Physical Therapy

## 2023-09-12 ENCOUNTER — Encounter (HOSPITAL_COMMUNITY): Payer: MEDICAID

## 2023-09-12 DIAGNOSIS — R0902 Hypoxemia: Secondary | ICD-10-CM | POA: Insufficient documentation

## 2023-09-16 ENCOUNTER — Telehealth: Payer: Self-pay | Admitting: *Deleted

## 2023-09-16 NOTE — Transitions of Care (Post Inpatient/ED Visit) (Signed)
   09/16/2023  Name: Lindsay Mcmillan MRN: [id number] DOB: Jun 18, 1971  Today's TOC FU Call Status: Today's TOC FU Call Status:: Unsuccessful Call (1st Attempt) Unsuccessful Call (1st Attempt) Date: 09/16/23  Attempted to reach the patient regarding the most recent Inpatient/ED visit.  Follow Up Plan: Additional outreach attempts will be made to reach the patient to complete the Transitions of Care (Post Inpatient/ED visit) call.   Irving Shows [facility], BSN RN Care Manager/ Transition of Care [place]/ [facility] [phone]

## 2023-09-17 ENCOUNTER — Telehealth: Payer: Self-pay

## 2023-09-17 ENCOUNTER — Encounter (HOSPITAL_COMMUNITY): Payer: MEDICAID | Admitting: Physical Therapy

## 2023-09-17 NOTE — Transitions of Care (Post Inpatient/ED Visit) (Signed)
   09/17/2023  Name: Lindsay Mcmillan MRN: [id number] DOB: 05/06/1971  Today's TOC FU Call Status: Today's TOC FU Call Status:: Unsuccessful Call (2nd Attempt) Unsuccessful Call (1st Attempt) Date: 09/16/23 Unsuccessful Call (2nd Attempt) Date: 09/17/23  Attempted to reach the patient regarding the most recent Inpatient/ED visit.  Follow Up Plan: Additional outreach attempts will be made to reach the patient to complete the Transitions of Care (Post Inpatient/ED visit) call.   Deidre Ala, BSN, RN [place]  [facility] - [facility] RN Care Manager [phone]

## 2023-09-18 ENCOUNTER — Ambulatory Visit: Payer: MEDICAID | Admitting: Physician Assistant

## 2023-09-18 ENCOUNTER — Telehealth: Payer: Self-pay | Admitting: *Deleted

## 2023-09-18 NOTE — Transitions of Care (Post Inpatient/ED Visit) (Signed)
   09/18/2023  Name: Lindsay Mcmillan MRN: [id number] DOB: 11-10-1970  Today's TOC FU Call Status: Today's TOC FU Call Status:: Unsuccessful Call (3rd Attempt) Unsuccessful Call (3rd Attempt) Date: 09/18/23  Attempted to reach the patient regarding the most recent Inpatient/ED visit.  Follow Up Plan: No further outreach attempts will be made at this time. We have been unable to contact the patient.  Irving Shows [facility], BSN RN Care Manager/ Transition of Care [place]/ [facility] [phone]

## 2023-09-19 ENCOUNTER — Encounter (HOSPITAL_COMMUNITY): Payer: MEDICAID

## 2023-09-24 ENCOUNTER — Encounter (HOSPITAL_COMMUNITY): Payer: MEDICAID

## 2023-09-25 ENCOUNTER — Ambulatory Visit (INDEPENDENT_AMBULATORY_CARE_PROVIDER_SITE_OTHER): Payer: MEDICAID | Admitting: Family Medicine

## 2023-09-25 VITALS — BP 138/78 | HR 76 | Temp 97.7°F | Ht 68.0 in | Wt 207.0 lb

## 2023-09-25 DIAGNOSIS — R42 Dizziness and giddiness: Secondary | ICD-10-CM | POA: Diagnosis not present

## 2023-09-25 DIAGNOSIS — R0781 Pleurodynia: Secondary | ICD-10-CM | POA: Diagnosis not present

## 2023-09-25 MED ORDER — BACLOFEN 10 MG PO TABS: 5.0000 mg | ORAL_TABLET | Freq: Three times a day (TID) | ORAL | 0 refills | Status: AC | PRN

## 2023-09-25 MED ORDER — MELOXICAM 15 MG PO TABS: 15.0000 mg | ORAL_TABLET | Freq: Every day | ORAL | 0 refills | Status: AC | PRN

## 2023-09-25 MED ORDER — MECLIZINE HCL 25 MG PO TABS
25.0000 mg | ORAL_TABLET | Freq: Three times a day (TID) | ORAL | 3 refills | Status: DC | PRN
Start: 2023-09-25 — End: 2024-04-27

## 2023-09-25 NOTE — Patient Instructions (Signed)
 Heat.   Medication as directed.  Xray of the thoracic spine.  We will call with the results.

## 2023-09-26 ENCOUNTER — Telehealth (HOSPITAL_COMMUNITY): Payer: Self-pay

## 2023-09-26 ENCOUNTER — Encounter (HOSPITAL_COMMUNITY): Payer: MEDICAID

## 2023-09-26 DIAGNOSIS — R0789 Other chest pain: Secondary | ICD-10-CM | POA: Insufficient documentation

## 2023-09-26 DIAGNOSIS — R0781 Pleurodynia: Secondary | ICD-10-CM | POA: Insufficient documentation

## 2023-09-26 NOTE — Telephone Encounter (Signed)
 No show, called and spoke to pt who stated she had forgotten about apt. Reviewed next apt date and time and assured she has contact number if needs to cancel/reschedule in the future.   Becky Sax, LPTA/CLT; [place] [phone]

## 2023-09-26 NOTE — Assessment & Plan Note (Signed)
 Likely MSK in origin.  Has a history of chronic pain.  Meloxicam and baclofen as directed.  Obtaining x-ray for further evaluation to ensure that this is not coming from the lumbar spine.

## 2023-09-26 NOTE — Progress Notes (Signed)
 Subjective:  Patient ID: Lindsay Mcmillan, female    DOB: [birth year]  Age: 53 y.o. MRN: [id number]  CC:   Chief Complaint  Patient presents with   rib ./ side pain     HPI:  53 year old female presents for evaluation of the above.  Patient reports chronic right sided lower rib pain which she has had for many years.  She states that recently over the past 6 to 7 months she has developed the same pain on the left side.  Patient states that the pain is quite troublesome.  Worse with certain movements.  Patient recently hospitalized for influenza and now has a new oxygen requirement.  She is on 3 L of oxygen continuously.  Recommended referral to pulmonology.  Patient Active Problem List   Diagnosis Date Noted   Rib pain 09/26/2023   Cigarette smoker 03/25/2023   History of substance abuse (HCC) 01/06/2023   Chronic bilateral low back pain 01/06/2023   Excessive daytime sleepiness 01/04/2023   Stimulant use disorder 07/06/2022   Essential hypertension 04/20/2020   Depression 04/20/2020   COPD GOLD  2 08/11/2019    Social Hx   Social History   Socioeconomic History   Marital status: Single    Spouse name: Not on file   Number of children: Not on file   Years of education: Not on file   Highest education level: Not on file  Occupational History   Not on file  Tobacco Use   Smoking status: Every Day    Current packs/day: 2.00    Average packs/day: 2.0 packs/day for 30.0 years (60.0 ttl pk-yrs)    Types: Cigarettes   Smokeless tobacco: Never  Vaping Use   Vaping status: Former  Substance and Sexual Activity   Alcohol use: No   Drug use: Yes    Types: Cocaine    Comment: fentanyl   Sexual activity: Yes    Birth control/protection: None  Other Topics Concern   Not on file  Social History Narrative   Lives with boyfriend of 17 years      Enjoys: limited       Diet: eats all food groups    Caffeine: 20 oz of coke    Water: never       Wears seat    Does not  use phone while driving    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   Does not Database administrator   Social Drivers of Health   Financial Resource Strain: Low Risk  (04/20/2020)   Overall Financial Resource Strain (CARDIA)    Difficulty of Paying Living Expenses: Not hard at all  Food Insecurity: Low Risk  (07/06/2022)   [facility]   Hunger Vital Sign    Worried About Running Out of Food in the Last Year: Never true    Within the past 12 months, the food you bought just didn't last and you didn't have money to get more: Not on file  Transportation Needs: Not on file (07/06/2022)  Physical Activity: Sufficiently Active (04/20/2020)   Exercise Vital Sign    Days of Exercise per Week: 4 days    Minutes of Exercise per Session: 60 min  Stress: Stress Concern Present (04/20/2020)   [employer] of Occupational Health - Occupational Stress Questionnaire    Feeling of Stress : Very much  Social Connections: Moderately Integrated (04/20/2020)   Social Connection and Isolation Panel [NHANES]    Frequency of  Communication with Friends and Family: More than three times a week    Frequency of Social Gatherings with Friends and Family: Once a week    Attends Religious Services: More than 4 times per year    Active Member of [employer] or Organizations: No    Attends Engineer, structural: Never    Marital Status: Living with partner    Review of Systems Per HPI  Objective:  BP 138/78   Pulse 76   Temp 97.7 F (36.5 C)   Ht 5\' 8"  (1.727 m)   Wt 207 lb (93.9 kg)   SpO2 95%   BMI 31.47 kg/m      09/25/2023    2:51 PM 08/07/2023    1:52 PM 07/16/2023    3:01 PM  BP/Weight  Systolic BP 138 178 156  Diastolic BP 78 102 96  Wt. (Lbs) 207 202 201  BMI 31.47 kg/m2 30.71 kg/m2 30.56 kg/m2    Physical Exam Vitals and nursing note reviewed.  Constitutional:      General: She is not in acute distress. HENT:     Head: Normocephalic and  atraumatic.  Cardiovascular:     Rate and Rhythm: Normal rate and regular rhythm.  Pulmonary:     Effort: Pulmonary effort is normal. No respiratory distress.  Musculoskeletal:     Comments: No significant tenderness of the ribs on exam.  Neurological:     Mental Status: She is alert.     Lab Results  Component Value Date   WBC 13.1 (H) 02/04/2023   HGB 12.1 02/04/2023   HCT 39.3 02/04/2023   PLT 193 02/04/2023   GLUCOSE 98 02/04/2023   CHOL 158 05/16/2020   TRIG 76 05/16/2020   HDL 50 05/16/2020   LDLCALC 93 05/16/2020   ALT 28 03/07/2022   AST 17 03/07/2022   NA 136 02/04/2023   K 3.8 02/04/2023   CL 106 02/04/2023   CREATININE 0.97 02/04/2023   BUN 17 02/04/2023   CO2 26 02/04/2023     Assessment & Plan:  Rib pain Assessment & Plan: Likely MSK in origin.  Has a history of chronic pain.  Meloxicam and baclofen as directed.  Obtaining x-ray for further evaluation to ensure that this is not coming from the lumbar spine.  Orders: -     DG Thoracic Spine W/Swimmers  Dizziness -     Meclizine HCl; Take 1 tablet (25 mg total) by mouth 3 (three) times daily as needed for dizziness.  Dispense: 30 tablet; Refill: 3  Other orders -     Meloxicam; Take 1 tablet (15 mg total) by mouth daily as needed for pain.  Dispense: 30 tablet; Refill: 0 -     Baclofen; Take 0.5-1 tablets (5-10 mg total) by mouth 3 (three) times daily as needed for muscle spasms.  Dispense: 30 each; Refill: 0    Follow-up: Pending x-ray result  Tuesday Terlecki Adriana Simas DO [facility]

## 2023-10-01 ENCOUNTER — Encounter (HOSPITAL_COMMUNITY): Payer: MEDICAID

## 2023-10-03 ENCOUNTER — Encounter (HOSPITAL_COMMUNITY): Payer: MEDICAID

## 2023-10-08 ENCOUNTER — Encounter (HOSPITAL_COMMUNITY): Payer: MEDICAID

## 2023-10-12 NOTE — Progress Notes (Deleted)
 Lindsay Mcmillan, female    DOB: Feb 18, 1971    MRN: [id number]   Brief patient profile:  54 yowf  active smoker  GOLD 2 copd criteria 11/17/19  referred to pulmonary clinic in [facility]  03/25/2023 by Lindsay Papa DO  for freq bronchitis and doe x 2014 and worse since rx trelegy > no better.     History of Present Illness  03/25/2023  Pulmonary/ 1st office eval/ Lindsay Mcmillan / [place] [facility] on trelegy 100 and lisinopril  40  Chief Complaint  Patient presents with   Establish Care   Shortness of Breath  Dyspnea:  walking x 100 ft no better on trelegy (? Helped at first)  Cough: p sev hours asleep assoc with loud wheezing  Sleep: 2 pillws flat freq cough esp in am > clear  SABA use: last used it 2-3 weeks  02: none  Lung cancer screen: referred today  Rec Stop trelegy and lisinopril   Olmesartan  40 mg one daily  Continue omeprazole  Take 30-60 min before first meal of the day and add pepcid otc 20 mg at bedtime until night time cough and wheeze are gone  Plan A = Automatic = Always=    Breztri  Take 2 puffs first thing in am and then another 2 puffs about 12 hours later.  Work on inhaler technique:  Plan B = Backup (to supplement plan A, not to replace it) Only use your albuterol  inhaler as a rescue medication  My office will be contacting you by phone for referral to lung cancer screening  [phone]  - did not do as of 10/14/2023   - if you don't hear back from my office within one week please call us  back or notify us  thru MyChart and we'll address it right away.  Please schedule a follow up office visit in 4 weeks, sooner if needed -bring inhalers    07/16/2023  f/u ov/[place] office/Lindsay Mcmillan re: GOLD 2 copd /? Acei case  maint on symbicort  160  did not  bring inhalers   Chief Complaint  Patient presents with   COPD  Dyspnea:  very inactive at home/ agoraphobia  Cough: better  Sleeping: no resp cc p saba qh    resp cc  SABA use: bid and at bedtime  02: none  Rec My office will be  contacting you by phone for referral to lung cancer screening  > not done as of 10/14/2023  - if you don't hear back from my office within one week please call us  back or notify us  thru MyChart and we'll address it right away.  Plan A = Automatic = Always=    Symbicort  160 Take 2 puffs first thing in am and then another 2 puffs about 12 hours later.   Work on inhaler technique:   Plan B = Backup (to supplement plan A, not to replace it) Only use your albuterol  inhaler as a rescue medication Plan C = Crisis (instead of Plan B but only if Plan B stops working) - only use your albuterol  nebulizer if you first try Plan B  Also  Ok to try albuterol  15 min before an activity (on alternating days)  that you know would usually make you short of breath    Please schedule a follow up visit in 3 months but call sooner if needed - bring inhalers    10/14/2023  f/u ov/[place] office/Lindsay Mcmillan re: GOLD 2 copd maint on *** did *** bring inhalers  No chief complaint on file.  Dyspnea:  *** Cough: *** Sleeping: ***   resp cc  SABA use: *** 02: ***  Lung cancer screening: ***   No obvious day to day or daytime variability or assoc excess/ purulent sputum or mucus plugs or hemoptysis or cp or chest tightness, subjective wheeze or overt sinus or hb symptoms.    Also denies any obvious fluctuation of symptoms with weather or environmental changes or other aggravating or alleviating factors except as outlined above   No unusual exposure hx or h/o childhood pna/ asthma or knowledge of premature birth.  Current Allergies, Complete Past Medical History, Past Surgical History, Family History, and Social History were reviewed in [employer] record.  ROS  The following are not active complaints unless bolded Hoarseness, sore throat, dysphagia, dental problems, itching, sneezing,  nasal congestion or discharge of excess mucus or purulent secretions, ear ache,   fever, chills, sweats,  unintended wt loss or wt gain, classically pleuritic or exertional cp,  orthopnea pnd or arm/hand swelling  or leg swelling, presyncope, palpitations, abdominal pain, anorexia, nausea, vomiting, diarrhea  or change in bowel habits or change in bladder habits, change in stools or change in urine, dysuria, hematuria,  rash, arthralgias, visual complaints, headache, numbness, weakness or ataxia or problems with walking or coordination,  change in mood or  memory.        No outpatient medications have been marked as taking for the 10/14/23 encounter (Appointment) with Lindsay Formica, MD.              Past Medical History:  Diagnosis Date   BOILS, RECURRENT 10/10/2009   Qualifier: Diagnosis of  By: Artelia Laroche.    Chest pain in adult 09/13/2019   COPD (chronic obstructive pulmonary disease) (HCC)    Encounter for screening mammogram for malignant neoplasm of breast 08/11/2019   Inguinal pain, right 12/09/2020   Substance abuse (HCC)    Tobacco dependence    Wound dehiscence       Objective:    wts   10/14/2023          ***   07/16/23 201 lb (91.2 kg)  06/18/23 209 lb 3.2 oz (94.9 kg)  03/27/23 198 lb (89.8 kg)     Vital signs reviewed  10/14/2023  - Note at rest 02 sats  ***% on ***   General appearance:    ***    Min bar ***        Assessment

## 2023-10-14 ENCOUNTER — Ambulatory Visit: Payer: MEDICAID | Admitting: Internal Medicine

## 2023-10-14 ENCOUNTER — Encounter: Payer: Self-pay | Admitting: Internal Medicine

## 2023-12-13 ENCOUNTER — Ambulatory Visit: Payer: Self-pay

## 2023-12-13 NOTE — Telephone Encounter (Signed)
 FYI Only or Action Required?: FYI only for provider  Patient was last seen in primary care on 09/25/2023 by Cook, Jayce G, DO. Called Nurse Triage reporting Breast Pain. Symptoms began several months ago. Interventions attempted: Nothing. Symptoms are: unchanged.  Triage Disposition: See PCP Within 2 Weeks  Patient/caregiver understands and will follow disposition?: Yes               Summary: tenderness and soreness in breasts   Copied From CRM [id number]. Reason for Triage: Pt having tenderness in breasts and soreness. Pt called imaging to schedule mammogram that was ordered previously by Dr. Debrah Fan. Pt informed to reach out to provider as they would need an order for a more in depth mammogram.  Pt requesting message be sent to provider    Reason for Disposition  [1] Breast pain AND [2] cause is not known  Answer Assessment - Initial Assessment Questions 1. SYMPTOM: "What's the main symptom you're concerned about?"  (e.g., lump, pain, rash, nipple discharge)     tenderness 2. LOCATION: "Where is the tenderness located?"     Side of both breast and left nipple 3. ONSET: "When did tenderness  start?"     A couple months ago 4. PRIOR HISTORY: "Do you have any history of prior problems with your breasts?" (e.g., lumps, cancer, fibrocystic breast disease)     unknown 5. CAUSE: "What do you think is causing this symptom?"     unknown 6. OTHER SYMPTOMS: "Do you have any other symptoms?" (e.g., fever, breast pain, redness or rash, nipple discharge)     no  Protocols used: Breast Symptoms-A-AH

## 2023-12-18 ENCOUNTER — Ambulatory Visit: Payer: MEDICAID | Admitting: Family Medicine

## 2024-02-28 ENCOUNTER — Encounter: Payer: Self-pay | Admitting: Radiology

## 2024-03-12 ENCOUNTER — Other Ambulatory Visit: Payer: Self-pay | Admitting: Internal Medicine

## 2024-04-23 ENCOUNTER — Ambulatory Visit: Payer: Self-pay | Admitting: Family Medicine

## 2024-04-27 ENCOUNTER — Ambulatory Visit (INDEPENDENT_AMBULATORY_CARE_PROVIDER_SITE_OTHER): Payer: MEDICAID | Admitting: Family Medicine

## 2024-04-27 VITALS — BP 150/84 | HR 69 | Temp 98.1°F | Ht 68.0 in | Wt 209.1 lb

## 2024-04-27 DIAGNOSIS — I10 Essential (primary) hypertension: Secondary | ICD-10-CM

## 2024-04-27 DIAGNOSIS — R0789 Other chest pain: Secondary | ICD-10-CM | POA: Diagnosis not present

## 2024-04-27 DIAGNOSIS — J449 Chronic obstructive pulmonary disease, unspecified: Secondary | ICD-10-CM

## 2024-04-27 DIAGNOSIS — N3946 Mixed incontinence: Secondary | ICD-10-CM | POA: Diagnosis not present

## 2024-04-27 DIAGNOSIS — R42 Dizziness and giddiness: Secondary | ICD-10-CM | POA: Diagnosis not present

## 2024-04-27 MED ORDER — ALBUTEROL SULFATE (2.5 MG/3ML) 0.083% IN NEBU: 2.5000 mg | INHALATION_SOLUTION | Freq: Four times a day (QID) | RESPIRATORY_TRACT | 12 refills | Status: AC | PRN

## 2024-04-27 MED ORDER — BUDESONIDE-FORMOTEROL FUMARATE 160-4.5 MCG/ACT IN AERO
2.0000 | INHALATION_SPRAY | Freq: Two times a day (BID) | RESPIRATORY_TRACT | 1 refills | Status: DC
Start: 2024-04-27 — End: 2024-05-27

## 2024-04-27 MED ORDER — ONDANSETRON 4 MG PO TBDP: 4.0000 mg | ORAL_TABLET | Freq: Three times a day (TID) | ORAL | 0 refills | Status: AC | PRN

## 2024-04-27 MED ORDER — MIRABEGRON ER 25 MG PO TB24: 25.0000 mg | ORAL_TABLET | Freq: Every day | ORAL | 1 refills | Status: AC

## 2024-04-27 MED ORDER — MECLIZINE HCL 25 MG PO TABS: 25.0000 mg | ORAL_TABLET | Freq: Three times a day (TID) | ORAL | 3 refills | Status: AC | PRN

## 2024-04-27 NOTE — Assessment & Plan Note (Signed)
 Patient having urge and stress incontinence.  Starting on Myrbetriq for urge/overactive bladder.

## 2024-04-27 NOTE — Assessment & Plan Note (Signed)
 Stable currently.  Meds refilled.

## 2024-04-27 NOTE — Assessment & Plan Note (Signed)
 Uncontrolled.  BP mildly elevated here today.  Patient has not taken her medications yet today.  Continue current medications.

## 2024-04-27 NOTE — Assessment & Plan Note (Signed)
 Patient with continued chest wall pain/rib pain.  X-rays for further evaluation.

## 2024-04-27 NOTE — Patient Instructions (Signed)
 Medication as prescribed.  Xray at the hospital.  Follow up in 6 months.  Take care  Dr. Bluford

## 2024-04-27 NOTE — Progress Notes (Signed)
 Subjective:  Patient ID: Lindsay Mcmillan, female    DOB: 1970/10/31  Age: 53 y.o. MRN: [id number]  CC:   Chief Complaint  Patient presents with   Bladder Uncontrolled    Pt. Is having uncontrolled bladder issues. Pt. Mentioned about being able to go to the hospital to get an X-ray from discomfort under the breast area    HPI:  53 year old female presents for evaluation of the above.  Patient reports issues with overactive bladder and urinary urgency.  She states that she has times where she has incontinence related to urge/overactive bladder.  Patient also endorses stress incontinence.  Patient interested in medication for overactive bladder.  Patient also reports continued pain particularly to the bilateral lower ribs/chest wall.  Worse when wearing a bra.  No relieving factors.  She states that it is severe at times.  Imaging has previously been ordered but has not been completed.  Will reorder imaging today.  Patient needs medication refills today.  Patient Active Problem List   Diagnosis Date Noted   Mixed incontinence 04/27/2024   Chest wall pain 04/27/2024   Cigarette smoker 03/25/2023   History of substance abuse (HCC) 01/06/2023   Chronic bilateral low back pain 01/06/2023   Excessive daytime sleepiness 01/04/2023   Stimulant use disorder 07/06/2022   Essential hypertension 04/20/2020   Depression 04/20/2020   COPD GOLD  2 08/11/2019    Social Hx   Social History   Socioeconomic History   Marital status: Single    Spouse name: Not on file   Number of children: Not on file   Years of education: Not on file   Highest education level: Not on file  Occupational History   Not on file  Tobacco Use   Smoking status: Every Day    Current packs/day: 2.00    Average packs/day: 2.0 packs/day for 30.0 years (60.0 ttl pk-yrs)    Types: Cigarettes   Smokeless tobacco: Never  Vaping Use   Vaping status: Former  Substance and Sexual Activity   Alcohol use: No    Drug use: Yes    Types: Cocaine    Comment: fentanyl   Sexual activity: Yes    Birth control/protection: None  Other Topics Concern   Not on file  Social History Narrative   Lives with boyfriend of 17 years      Enjoys: limited       Diet: eats all food groups    Caffeine: 20 oz of coke    Water: never       Wears seat    Does not use phone while driving    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   Does not Database administrator   Social Drivers of Health   Financial Resource Strain: Low Risk  (04/20/2020)   Overall Financial Resource Strain (CARDIA)    Difficulty of Paying Living Expenses: Not hard at all  Food Insecurity: Low Risk  (07/06/2022)   Received from [facility]   Hunger Vital Sign    Within the past 12 months, you worried that your food would run out before you got money to buy more: Never true    Within the past 12 months, the food you bought just didn't last and you didn't have money to get more: Not on file  Transportation Needs: Not on file (07/06/2022)  Physical Activity: Sufficiently Active (04/20/2020)   Exercise Vital Sign    Days of Exercise per Week: 4 days  Minutes of Exercise per Session: 60 min  Stress: Stress Concern Present (04/20/2020)   [employer] of Occupational Health - Occupational Stress Questionnaire    Feeling of Stress : Very much  Social Connections: Moderately Integrated (04/20/2020)   Social Connection and Isolation Panel    Frequency of Communication with Friends and Family: More than three times a week    Frequency of Social Gatherings with Friends and Family: Once a week    Attends Religious Services: More than 4 times per year    Active Member of [employer] or Organizations: No    Attends Engineer, structural: Never    Marital Status: Living with partner    Review of Systems Per HPI  Objective:  BP (!) 150/84 (BP Location: Left Arm, Cuff Size: Large)   Pulse 69   Temp 98.1 F (36.7 C)   Ht 5' 8 (1.727  m)   Wt 209 lb 2 oz (94.9 kg)   SpO2 96%   BMI 31.80 kg/m      04/27/2024   11:16 AM 04/27/2024   10:55 AM 09/25/2023    2:51 PM  BP/Weight  Systolic BP 150 192 138  Diastolic BP 84 89 78  Wt. (Lbs)  209.13 207  BMI  31.8 kg/m2 31.47 kg/m2    Physical Exam Vitals and nursing note reviewed.  Constitutional:      General: She is not in acute distress.    Appearance: Normal appearance.  HENT:     Head: Normocephalic and atraumatic.  Cardiovascular:     Rate and Rhythm: Normal rate and regular rhythm.  Pulmonary:     Effort: Pulmonary effort is normal.     Breath sounds: Normal breath sounds. No wheezing or rales.  Chest:     Chest wall: No tenderness.  Neurological:     Mental Status: She is alert.     Lab Results  Component Value Date   WBC 13.1 (H) 02/04/2023   HGB 12.1 02/04/2023   HCT 39.3 02/04/2023   PLT 193 02/04/2023   GLUCOSE 98 02/04/2023   CHOL 158 05/16/2020   TRIG 76 05/16/2020   HDL 50 05/16/2020   LDLCALC 93 05/16/2020   ALT 28 03/07/2022   AST 17 03/07/2022   NA 136 02/04/2023   K 3.8 02/04/2023   CL 106 02/04/2023   CREATININE 0.97 02/04/2023   BUN 17 02/04/2023   CO2 26 02/04/2023     Assessment & Plan:  Mixed incontinence Assessment & Plan: Patient having urge and stress incontinence.  Starting on Myrbetriq for urge/overactive bladder.  Orders: -     Mirabegron ER; Take 1 tablet (25 mg total) by mouth daily.  Dispense: 90 tablet; Refill: 1  Dizziness -     Meclizine  HCl; Take 1 tablet (25 mg total) by mouth 3 (three) times daily as needed for dizziness.  Dispense: 30 tablet; Refill: 3  Chest wall pain Assessment & Plan: Patient with continued chest wall pain/rib pain.  X-rays for further evaluation.  Orders: -     DG Thoracic Spine W/Swimmers -     DG Ribs Unilateral Left -     DG Ribs Unilateral Right  COPD GOLD  2 Assessment & Plan: Stable currently.  Meds refilled.  Orders: -     Albuterol  Sulfate; Take 3 mLs (2.5  mg total) by nebulization every 6 (six) hours as needed.  Dispense: 75 mL; Refill: 12 -     Budesonide -Formoterol  Fumarate; Inhale 2 puffs into the  lungs 2 (two) times daily.  Dispense: 1 each; Refill: 1  Essential hypertension Assessment & Plan: Uncontrolled.  BP mildly elevated here today.  Patient has not taken her medications yet today.  Continue current medications.   Other orders -     Ondansetron ; Take 1 tablet (4 mg total) by mouth every 8 (eight) hours as needed for nausea or vomiting.  Dispense: 20 tablet; Refill: 0    Follow-up:  6 months  Meggie Laseter Bluford DO [facility]

## 2024-04-28 ENCOUNTER — Encounter: Payer: Self-pay | Admitting: Internal Medicine

## 2024-04-28 ENCOUNTER — Ambulatory Visit: Payer: MEDICAID | Admitting: Internal Medicine

## 2024-04-28 VITALS — BP 193/115 | HR 92 | Ht 68.0 in | Wt 205.2 lb

## 2024-04-28 DIAGNOSIS — F17210 Nicotine dependence, cigarettes, uncomplicated: Secondary | ICD-10-CM

## 2024-04-28 DIAGNOSIS — J449 Chronic obstructive pulmonary disease, unspecified: Secondary | ICD-10-CM

## 2024-04-28 DIAGNOSIS — I10 Essential (primary) hypertension: Secondary | ICD-10-CM | POA: Diagnosis not present

## 2024-04-28 MED ORDER — FAMOTIDINE 20 MG PO TABS: ORAL_TABLET | ORAL | 11 refills | Status: AC

## 2024-04-28 MED ORDER — BUDESONIDE 0.25 MG/2ML IN SUSP: RESPIRATORY_TRACT | 12 refills | Status: AC

## 2024-04-28 MED ORDER — IPRATROPIUM-ALBUTEROL 0.5-2.5 (3) MG/3ML IN SOLN: 3.0000 mL | Freq: Four times a day (QID) | RESPIRATORY_TRACT | 11 refills | Status: AC

## 2024-04-28 NOTE — Progress Notes (Unsigned)
 Lindsay Mcmillan, female    DOB: April 23, 1971    MRN: [id number]   Brief patient profile:  53 yowf  active smoker  GOLD 2 copd criteria 11/17/19  referred to pulmonary clinic in [facility]  03/25/2023 by Jacqulyn Ahle DO  for freq bronchitis and doe x 2014 and worse since rx trelegy > no better.     History of Present Illness  03/25/2023  Pulmonary/ 1st office eval/ Aidel Davisson / [place] Office on trelegy 100 and lisinopril  40  Chief Complaint  Patient presents with   Establish Care   Shortness of Breath  Dyspnea:  walking x 100 ft no better on trelegy (? Helped at first)  Cough: p sev hours asleep assoc with loud wheezing  Sleep: 2 pillws flat freq cough esp in am > clear  SABA use: last used it 2-3 weeks  02: none  Lung cancer screen: referred today  Rec Stop trelegy and lisinopril   Olmesartan  40 mg one daily  Continue omeprazole  Take 30-60 min before first meal of the day and add pepcid otc 20 mg at bedtime until night time cough and wheeze are gone  Plan A = Automatic = Always=    Breztri  Take 2 puffs first thing in am and then another 2 puffs about 12 hours later.  Work on inhaler technique:  Plan B = Backup (to supplement plan A, not to replace it) Only use your albuterol  inhaler as a rescue medication  My office will be contacting you by phone for referral to lung cancer screening  [phone]  - did not do as of 07/16/2023  - if you don't hear back from my office within one week please call us  back or notify us  thru MyChart and we'll address it right away.  Please schedule a follow up office visit in 4 weeks, sooner if needed -bring inhalers    07/16/2023  f/u ov/[place] office/Kelan Pritt re: GOLD 2 copd /? Acei case  maint on symbicort  160  did not  bring inhalers   Chief Complaint  Patient presents with   COPD  Dyspnea:  very inactive at home/ agoraphobia  Cough: better  Sleeping: no resp cc p saba qh    resp cc  SABA use: bid and at bedtime  02: none  Lung cancer screening:  today  Rec My office will be contacting you by phone for referral to lung cancer screening  > not done as of 04/28/2024  Plan A = Automatic = Always=    Symbicort  160 Take 2 puffs first thing in am and then another 2 puffs about 12 hours later.  Work on inhaler technique:   Plan B = Backup (to supplement plan A, not to replace it) Only use your albuterol  inhaler as a rescue medication Plan C = Crisis (instead of Plan B but only if Plan B stops working) - only use your albuterol  nebulizer if you first try Plan B  Also  Ok to try albuterol  15 min before an activity (on alternating days)  that you know would usually make you short of breath     Please schedule a follow up visit in 3 months but call sooner if needed - bring inhalers   04/28/2024  f/u ov/[place] office/Cristin Szatkowski re: GOLD 2 COPD  maint on just neb(nothing else works for her)  q 6 h    active smoker  Stage manager Complaint  Patient presents with   COPD    SHOB / Coughing with thick white/yellow mucus getting worse  Dyspnea:  [employer]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    Also denies any obvious fluctuation of symptoms with weather or environmental changes or other aggravating or alleviating factors except as outlined above   No unusual exposure hx or h/o childhood pna/ asthma or knowledge of premature birth.  Current Allergies, Complete Past Medical History, Past Surgical History, Family History, and Social History were reviewed in [employer] record.  ROS  The  following are not active complaints unless bolded Hoarseness, sore throat, dysphagia, dental problems, itching, sneezing,   or discharge of excess mucus or purulent secretions, ear ache,   fever, chills, sweats, unintended wt loss or wt gain, classically pleuritic or exertional cp,  orthopnea pnd or arm/hand swelling  or leg swelling, presyncope, palpitations, abdominal pain, anorexia, nausea, vomiting, diarrhea  or change in bowel habits or change in bladder habits, change in stools or change in urine, dysuria, hematuria,  rash, arthralgias, visual complaints, headache, numbness, weakness or ataxia or problems with walking or coordination,  change in mood or  memory.        Current Meds  Medication Sig   acetaminophen  (TYLENOL ) 500 MG tablet Take 1,000 mg by mouth every 6 (six) hours as needed for mild pain or headache.   albuterol  (PROVENTIL ) (2.5 MG/3ML) 0.083% nebulizer solution Take 3 mLs (2.5 mg total) by nebulization every 6 (six) hours as needed.   baclofen  (LIORESAL ) 10 MG tablet Take 0.5-1 tablets (5-10 mg total) by mouth 3 (three) times daily as needed for muscle spasms.   budesonide  (PULMICORT ) 0.25 MG/2ML nebulizer solution Combine with albuterol  in nebulizer twice daily   budesonide -formoterol  (SYMBICORT ) 160-4.5 MCG/ACT inhaler Inhale 2 puffs into the lungs 2 (two) times daily.   buprenorphine (SUBUTEX) 8 MG SUBL SL tablet Place 8 mg under the tongue daily.   cloNIDine (CATAPRES) 0.1 MG tablet Take 0.1 mg by mouth 3 (three) times daily.   escitalopram (LEXAPRO) 10 MG tablet Take by mouth.   famotidine (PEPCID) 20 MG tablet One after supper   meclizine  (ANTIVERT ) 25 MG tablet Take 1 tablet (25 mg total) by mouth 3 (three) times daily as needed for dizziness.   mirabegron ER (MYRBETRIQ) 25 MG TB24 tablet Take 1 tablet (25 mg total) by mouth daily.   olmesartan  (BENICAR ) 40 MG tablet Take 1 tablet (40 mg total) by mouth daily.   omeprazole  (PRILOSEC) 40 MG capsule TAKE 1 CAPSULE(40 MG)  BY MOUTH DAILY   ondansetron  (ZOFRAN -ODT) 4 MG disintegrating tablet Take 1 tablet (4 mg total) by mouth every 8 (eight) hours as needed for nausea or vomiting.   QUEtiapine (SEROQUEL) 25 MG tablet Take 25 mg by mouth at bedtime.   [DISCONTINUED] ipratropium-albuterol  (DUONEB) 0.5-2.5 (3) MG/3ML SOLN Inhale 3 mLs into the lungs every 6 (six) hours as needed.                Past Medical History:  Diagnosis Date  BOILS, RECURRENT 10/10/2009   Qualifier: Diagnosis of  By: Ezzard DEVONNA Sonny CANDIE    Chest pain in adult 09/13/2019   COPD (chronic obstructive pulmonary disease) (HCC)    Encounter for screening mammogram for malignant neoplasm of breast 08/11/2019   Inguinal pain, right 12/09/2020   Substance abuse (HCC)    Tobacco dependence    Wound dehiscence       Objective:    wts    04/28/2024    205    07/16/23 201 lb (91.2 kg)  06/18/23 209 lb 3.2 oz (94.9 kg)  03/27/23 198 lb (89.8 kg)    Vital signs reviewed  04/28/2024  - Note at rest 02 sats  91% on RA Note bp was p missed dose(s) of clonidine so given one dose of 0.1 mg in office    General appearance:    somer amber wf, min rattling cough    HEENT : Oropharynx  clear   Nasal turbinates nl    NECK :  without  apparent JVD/ palpable Nodes/TM    LUNGS: no acc muscle use,  Min barrel  contour chest wall with bilateral  slightly decreased bs s audible wheeze and  without cough on insp or exp maneuvers and min  Hyperresonant  to  percussion bilaterally    CV:  RRR  no s3 or murmur or increase in P2, and no edema   ABD:  soft and nontender    MS:  Nl gait/ ext warm without deformities Or obvious joint restrictions  calf tenderness, cyanosis or clubbing     SKIN: warm and dry without lesions    NEURO:  alert, approp, nl sensorium with  no motor or cerebellar deficits apparent.             Assessment     Assessment & Plan COPD GOLD  2 Active Smoker - PFT's  11/17/19  FEV1 2.16 (68 % ) ratio 0.63  p 6%  improvement from saba p ? prior to study with DLCO  12.74 (53%)   and FV curve flat insp portion and ERV 47 at wt 194 - CT Chest  11/17/19 Mild centrilobular emphysema  - 03/25/2023  After extensive coaching inhaler device,  effectiveness =    75% (short Ti) try change trelegy to breztri  2bid samples and approp saba - 03/25/2023 d/c acei (see hbp)   >>>04/28/2024 change completely over to duoneb qid/ budesonide  0.25 bid as she says nebs are the only thing that works for her    >>> for cough =  CB vs UACS rec max mucinexdm 1st gen H1 blockers per guidelines  at hs and add flutter valve next    Cigarette smoker Active smoker   Low-dose CT lung cancer screening is recommended for patients who are 70-83 years of age with a 20+ pack-year history of smoking and who are currently smoking or quit <=15 years ago. No coughing up blood  No unintentional weight loss of > 15 pounds in the last 6 months - pt is eligible for scanning yearly until 15 y p quit >  referred 04/28/2024   3  min discussion re active cigarette smoking in addition to office E&M  Ask about tobacco use:   ongoing Advise quitting   I took an extended  opportunity with this patient to outline the consequences of continued cigarette use  in airway disorders based on all the data we have from the multiple national lung health studies (perfomed over decades at millions  of dollars in cost)  indicating that smoking cessation, not choice of inhalers or pulmonary physicians, is the most important aspect of her care and can expect good response to cough rx once stops smoking Assess willingness:  Not committed at this point Assist in quit attempt:  Per PCP when ready Arrange follow up:   Follow up per Primary Care planned          Essential hypertension D/c acei  03/25/2023 - pseudowheeze   Advise on rebound effects of stopping clonidine and check back with pcp if not better back on regular doses of clonidine which may also reduce her  urge to smoke         Each maintenance medication was reviewed in detail including emphasizing most importantly the difference between maintenance and prns and under what circumstances the prns are to be triggered using an action plan format where appropriate.  Total time for H and P, chart review, counseling, reviewing hfa/neb device(s) and generating customized AVS unique to this office visit / same day charting = 30 min          AVS  Patient Instructions  My office will be contacting you by phone for referral to lung cancer screening   [phone]) - if you don't hear back from my office within one week,  please call us  back or notify us  thru MyChart and we'll address it right away.    Duoneb 4 x daily and on 1st and 3rd dose add budesonide  0.25 mg   Omeprazole  40 mg   Take  30-60 min before first meal of the day and Pepcid (famotidine)  20 mg after supper until return to office - this is the best way to tell whether stomach acid is contributing to your problem.    GERD (REFLUX)  is an extremely common cause of respiratory symptoms just like yours , many times with no obvious heartburn at all.    It can be treated with medication, but also with lifestyle changes including elevation of the head of your bed (ideally with 6 -8inch blocks under the headboard of your bed),  Smoking cessation, avoidance of late meals, excessive alcohol, and avoid fatty foods, chocolate, peppermint, colas, red wine, and acidic juices such as orange juice.  NO MINT OR MENTHOL PRODUCTS SO NO COUGH DROPS  USE SUGARLESS CANDY INSTEAD (Jolley ranchers or Stover's or Life Savers) or even ice chips will also do - the key is to swallow to prevent all throat clearing. NO OIL BASED VITAMINS - use powdered substitutes.  Avoid fish oil when coughing.   For cough/ congestion >  mucinex dm  up to maximum of  1200 mg every 12 hours as needed   The key is to stop smoking completely before smoking completely stops  you!   For drainage / throat tickle try take CHLORPHENIRAMINE  4 mg  (Allergy Relief 4mg   at [facility] should be easiest to find in the blue box usually on bottom shelf)  take one every 4 hours as needed - extremely effective and inexpensive over the counter- may cause drowsiness so start with just a dose or two an hour before bedtime along with the Pepcid 20 mg and see how you tolerate it before trying in daytime.       Please schedule a follow up visit in 3 months but call sooner if needed     Ozell America, MD 04/29/2024

## 2024-04-28 NOTE — Patient Instructions (Addendum)
 My office will be contacting you by phone for referral to lung cancer screening   [phone]) - if you don't hear back from my office within one week,  please call us  back or notify us  thru MyChart and we'll address it right away.    Duoneb 4 x daily and on 1st and 3rd dose add budesonide  0.25 mg   Omeprazole  40 mg   Take  30-60 min before first meal of the day and Pepcid (famotidine)  20 mg after supper until return to office - this is the best way to tell whether stomach acid is contributing to your problem.    GERD (REFLUX)  is an extremely common cause of respiratory symptoms just like yours , many times with no obvious heartburn at all.    It can be treated with medication, but also with lifestyle changes including elevation of the head of your bed (ideally with 6 -8inch blocks under the headboard of your bed),  Smoking cessation, avoidance of late meals, excessive alcohol, and avoid fatty foods, chocolate, peppermint, colas, red wine, and acidic juices such as orange juice.  NO MINT OR MENTHOL PRODUCTS SO NO COUGH DROPS  USE SUGARLESS CANDY INSTEAD (Jolley ranchers or Stover's or Life Savers) or even ice chips will also do - the key is to swallow to prevent all throat clearing. NO OIL BASED VITAMINS - use powdered substitutes.  Avoid fish oil when coughing.   For cough/ congestion >  mucinex dm  up to maximum of  1200 mg every 12 hours as needed   The key is to stop smoking completely before smoking completely stops you!   For drainage / throat tickle try take CHLORPHENIRAMINE  4 mg  (Allergy Relief 4mg   at [facility] should be easiest to find in the blue box usually on bottom shelf)  take one every 4 hours as needed - extremely effective and inexpensive over the counter- may cause drowsiness so start with just a dose or two an hour before bedtime along with the Pepcid 20 mg and see how you tolerate it before trying in daytime.       Please schedule a follow up visit in 3  months but call sooner if needed

## 2024-04-28 NOTE — Assessment & Plan Note (Addendum)
 Active smoker   Low-dose CT lung cancer screening is recommended for patients who are 54-55 years of age with a 20+ pack-year history of smoking and who are currently smoking or quit <=15 years ago. No coughing up blood  No unintentional weight loss of > 15 pounds in the last 6 months - pt is eligible for scanning yearly until 15 y p quit >  referred 04/28/2024   3  min discussion re active cigarette smoking in addition to office E&M  Ask about tobacco use:   ongoing Advise quitting   I took an extended  opportunity with this patient to outline the consequences of continued cigarette use  in airway disorders based on all the data we have from the multiple national lung health studies (perfomed over decades at millions of dollars in cost)  indicating that smoking cessation, not choice of inhalers or pulmonary physicians, is the most important aspect of her care and can expect good response to cough rx once stops smoking Assess willingness:  Not committed at this point Assist in quit attempt:  Per PCP when ready Arrange follow up:   Follow up per Primary Care planned

## 2024-04-29 NOTE — Assessment & Plan Note (Addendum)
 D/c acei  03/25/2023 - pseudowheeze   Advise on rebound effects of stopping clonidine and check back with pcp if not better back on regular doses of clonidine which may also reduce her urge to smoke         Each maintenance medication was reviewed in detail including emphasizing most importantly the difference between maintenance and prns and under what circumstances the prns are to be triggered using an action plan format where appropriate.  Total time for H and P, chart review, counseling, reviewing hfa/neb device(s) and generating customized AVS unique to this office visit / same day charting = 30 min

## 2024-04-29 NOTE — Assessment & Plan Note (Addendum)
 Active Smoker - PFT's  11/17/19  FEV1 2.16 (68 % ) ratio 0.63  p 6% improvement from saba p ? prior to study with DLCO  12.74 (53%)   and FV curve flat insp portion and ERV 47 at wt 194 - CT Chest  11/17/19 Mild centrilobular emphysema  - 03/25/2023  After extensive coaching inhaler device,  effectiveness =    75% (short Ti) try change trelegy to breztri  2bid samples and approp saba - 03/25/2023 d/c acei (see hbp)   >>>04/28/2024 change completely over to duoneb qid/ budesonide  0.25 bid as she says nebs are the only thing that works for her    >>> for cough =  CB vs UACS rec max mucinexdm 1st gen H1 blockers per guidelines  at hs and add flutter valve next

## 2024-05-08 ENCOUNTER — Telehealth: Payer: Self-pay

## 2024-05-08 NOTE — Telephone Encounter (Signed)
 Copied from CRM #[id number]. Topic: Clinical - Medical Advice >> May 07, 2024  1:05 PM Russell PARAS wrote: Reason for CRM:   Pt contacted clinic regarding her last visit with Wert. She advised him that her cough had become more severe; however, she has noticed since the changes in medication that he ordered, its not her cough that is getting worse, its her breathing. She wanted to notify provider in case he wanted to make changes in treatment  CB#  [phone]   Please advise

## 2024-05-11 ENCOUNTER — Encounter: Payer: Self-pay | Admitting: Radiology

## 2024-05-15 NOTE — Telephone Encounter (Signed)
 Pt needs acute appt or to ER is it gets worse over the weekend

## 2024-05-15 NOTE — Telephone Encounter (Signed)
 Scheduled 11/10 w/ wert for acute visit.

## 2024-05-18 ENCOUNTER — Ambulatory Visit: Payer: MEDICAID | Admitting: Internal Medicine

## 2024-05-18 DIAGNOSIS — J449 Chronic obstructive pulmonary disease, unspecified: Secondary | ICD-10-CM

## 2024-05-18 NOTE — Progress Notes (Deleted)
 Lindsay Mcmillan, female    DOB: 1971-02-24    MRN: [id number]   Brief patient profile:  61 yowf  active smoker  GOLD 2 copd criteria 11/17/19  referred to pulmonary clinic in [facility]  03/25/2023 by Lindsay Ahle DO  for freq bronchitis and doe x 2014 and worse since rx trelegy > no better.     History of Present Illness  03/25/2023  Pulmonary/ 1st Mcmill[place] eval/ Lindsay Mcmillan / [place] Mcmillan on trelegy 100 and lisinopril  40  Chief Complaint  Patient presents with   Establish Care   Shortness of Breath  Dyspnea:  walking x 100 ft no better on trelegy (? Helped at first)  Cough: p sev hours asleep assoc with loud wheezing  Sleep: 2 pillws flat freq cough esp in am > clear  SABA use: last used it 2-3 weeks  02: none  Lung cancer screen: referred today  Rec Stop trelegy and lisinopril   Olmesartan  40 mg one daily  Continue omeprazole  Take 30-60 min before first meal of the day and add pepcid otc 20 mg at bedtime until night time cough and wheeze are gone  Plan A = Automatic = Always=    Breztri  Take 2 puffs first thing in am and then another 2 puffs about 12 hours later.  Work on inhaler technique:  Plan B = Backup (to supplement plan A, not to replace it) Only use your albuterol  inhaler as a rescue medication  My Mcmillan will be contacting you by phone for referral to lung cancer screening  [phone]  - did not do as of 07/16/2023  - if you don't hear back from my Mcmillan within one week please call us  back or notify us  thru MyChart and we'll address it right away.  Please schedule a follow up Mcmillan visit in 4 weeks, sooner if needed -bring inhalers    07/16/2023  f/u ov/Lindsay Mcmillan/Lindsay Mcmillan re: GOLD 2 copd /? Acei case  maint on symbicort  160  did not  bring inhalers   Chief Complaint  Patient presents with   COPD  Dyspnea:  very inactive at home/ agoraphobia  Cough: better  Sleeping: no resp cc p saba qh    resp cc  SABA use: bid and at bedtime  02: none  Lung cancer screening:  today  Rec My Mcmillan will be contacting you by phone for referral to lung cancer screening  > not done as of 04/28/2024  Plan A = Automatic = Always=    Symbicort  160 Take 2 puffs first thing in am and then another 2 puffs about 12 hours later.  Work on inhaler technique:   Plan B = Backup (to supplement plan A, not to replace it) Only use your albuterol  inhaler as a rescue medication Plan C = Crisis (instead of Plan B but only if Plan B stops working) - only use your albuterol  nebulizer if you first try Plan B  Also  Ok to try albuterol  15 min before an activity (on alternating days)  that you know would usually make you short of breath     Please schedule a follow up visit in 3 months but call sooner if needed - bring inhalers   04/28/2024  f/u ov/[place] Mcmillan/Lindsay Mcmillan re: GOLD 2 COPD  maint on just neb(nothing else works for her)  q 6 h    active smoker  Stage Manager Complaint  Patient presents with   COPD    SHOB / Coughing with thick white/yellow mucus getting worse  Dyspnea:  [employer]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  no change on vs off symbiocrt  Cough: thick sticky varies yellow and white better with neb for a few hours Sleeping: bed is flat 2 pillows on side 3 x wakes up coughing    resp cc  SABA use: neb is q6  02: 3lpm  Lung cancer screening: referred 04/28/2024  Patient Instructions  Duoneb 4 x daily and on 1st and 3rd dose add budesonide  0.25 mg  Omeprazole  40 mg   Take  30-60 min before first meal of the day and Pepcid (famotidine)  20 mg after supper until return to Mcmillan  For cough/ congestion >  mucinex dm  up to maximum of  1200 mg every 12 hours as needed  The key is to stop smoking completely before smoking completely stops you! For drainage / throat tickle try take CHLORPHENIRAMINE  4 mg    Please schedule a follow up visit in 3 months but call sooner if needed   LDSCT not done as of 05/18/2024.  05/18/2024 ACUTE  ov/[place] Mcmillan/Lindsay Mcmillan re: GOLD  2 copd  maint on ***  *** smoker  No chief complaint on file.   Dyspnea:  *** Cough: *** Sleeping: ***   resp cc  SABA use: *** 02: ***  Lung cancer screening: ***   No obvious day to day or daytime variability or assoc excess/ purulent sputum or mucus plugs or hemoptysis or cp or chest tightness, subjective wheeze or overt sinus or hb symptoms.    Also denies any obvious fluctuation of symptoms with weather or environmental changes or other aggravating or alleviating factors except as outlined above   No unusual exposure hx or h/o childhood pna/ asthma or knowledge of premature birth.  Current Allergies, Complete Past Medical History, Past Surgical History, Family History, and Social History were reviewed in [employer] record.  ROS  The following are not active complaints unless bolded Hoarseness, sore throat, dysphagia, dental problems, itching, sneezing,  nasal congestion or discharge of excess mucus or purulent secretions, ear ache,   fever, chills, sweats, unintended wt loss or wt gain, classically pleuritic or exertional cp,  orthopnea pnd or arm/hand swelling  or leg swelling, presyncope, palpitations, abdominal pain, anorexia, nausea, vomiting, diarrhea  or change in bowel habits or change in bladder habits, change in stools or change in urine, dysuria, hematuria,  rash, arthralgias, visual complaints, headache, numbness, weakness or ataxia or problems with walking or coordination,  change in mood or  memory.        No outpatient medications have been marked as taking for the 05/18/24 encounter (Appointment) with Lindsay Ozell NOVAK, MD.                Past Medical History:  Diagnosis Date   BOILS, RECURRENT 10/10/2009   Qualifier: Diagnosis of  By: Lindsay Mcmillan.    Chest pain in adult 09/13/2019   COPD (chronic obstructive pulmonary disease) (HCC)    Encounter for screening mammogram for malignant neoplasm of breast 08/11/2019   Inguinal  pain, right 12/09/2020   Substance abuse (HCC)    Tobacco dependence    Wound dehiscence       Objective:    wts   05/18/2024      ***   04/28/2024    205    07/16/23 201 lb (91.2 kg)  06/18/23 209 lb 3.2 oz (94.9 kg)  03/27/23 198 lb (  89.8 kg)    Vital signs reviewed  05/18/2024  - Note at rest 02 sats  ***% on ***   General appearance:    ***  Min bar ***           Assessment

## 2024-05-19 ENCOUNTER — Encounter: Payer: Self-pay | Admitting: Internal Medicine

## 2024-05-19 ENCOUNTER — Ambulatory Visit: Payer: MEDICAID | Admitting: Internal Medicine

## 2024-05-19 DIAGNOSIS — J449 Chronic obstructive pulmonary disease, unspecified: Secondary | ICD-10-CM

## 2024-05-19 NOTE — Progress Notes (Deleted)
 Lindsay Mcmillan, female    DOB: 16-Jun-1971    MRN: [id number]   Brief patient profile:  16 yowf  active smoker  GOLD 2 copd criteria 11/17/19  referred to pulmonary clinic in [facility]  03/25/2023 by Jacqulyn Ahle DO  for freq bronchitis and doe x 2014 and worse since rx trelegy > no better.     History of Present Illness  03/25/2023  Pulmonary/ 1st office eval/ Ambrose Wile / [place] [facility] on trelegy 100 and lisinopril  40  Chief Complaint  Patient presents with   Establish Care   Shortness of Breath  Dyspnea:  walking x 100 ft no better on trelegy (? Helped at first)  Cough: p sev hours asleep assoc with loud wheezing  Sleep: 2 pillws flat freq cough esp in am > clear  SABA use: last used it 2-3 weeks  02: none  Lung cancer screen: referred today  Rec Stop trelegy and lisinopril   Olmesartan  40 mg one daily  Continue omeprazole  Take 30-60 min before first meal of the day and add pepcid otc 20 mg at bedtime until night time cough and wheeze are gone  Plan A = Automatic = Always=    Breztri  Take 2 puffs first thing in am and then another 2 puffs about 12 hours later.  Work on inhaler technique:  Plan B = Backup (to supplement plan A, not to replace it) Only use your albuterol  inhaler as a rescue medication  My office will be contacting you by phone for referral to lung cancer screening  [phone]  - did not do as of 07/16/2023  - if you don't hear back from my office within one week please call us  back or notify us  thru MyChart and we'll address it right away.  Please schedule a follow up office visit in 4 weeks, sooner if needed -bring inhalers    07/16/2023  f/u ov/[place] office/Lashon Hillier re: GOLD 2 copd /? Acei case  maint on symbicort  160  did not  bring inhalers   Chief Complaint  Patient presents with   COPD  Dyspnea:  very inactive at home/ agoraphobia  Cough: better  Sleeping: no resp cc p saba qh    resp cc  SABA use: bid and at bedtime  02: none  Lung cancer screening:  today  Rec My office will be contacting you by phone for referral to lung cancer screening  > not done as of 04/28/2024  Plan A = Automatic = Always=    Symbicort  160 Take 2 puffs first thing in am and then another 2 puffs about 12 hours later.  Work on inhaler technique:   Plan B = Backup (to supplement plan A, not to replace it) Only use your albuterol  inhaler as a rescue medication Plan C = Crisis (instead of Plan B but only if Plan B stops working) - only use your albuterol  nebulizer if you first try Plan B  Also  Ok to try albuterol  15 min before an activity (on alternating days)  that you know would usually make you short of breath     Please schedule a follow up visit in 3 months but call sooner if needed - bring inhalers   04/28/2024  f/u ov/[place] office/Eiley Mcginnity re: GOLD 2 COPD  maint on just neb(nothing else works for her)  q 6 h    active smoker  Stage Manager Complaint  Patient presents with   COPD    SHOB / Coughing with thick white/yellow mucus getting worse  Dyspnea:  [employer]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  no change on vs off symbiocrt  Cough: thick sticky varies yellow and white better with neb for a few hours Sleeping: bed is flat 2 pillows on side 3 x wakes up coughing    resp cc  SABA use: neb is q6  02: 3lpm  Lung cancer screening: referred 04/28/2024  Patient Instructions  Duoneb 4 x daily and on 1st and 3rd dose add budesonide  0.25 mg  Omeprazole  40 mg   Take  30-60 min before first meal of the day and Pepcid (famotidine)  20 mg after supper until return to office  For cough/ congestion >  mucinex dm  up to maximum of  1200 mg every 12 hours as needed  The key is to stop smoking completely before smoking completely stops you! For drainage / throat tickle try take CHLORPHENIRAMINE  4 mg    Please schedule a follow up visit in 3 months but call sooner if needed   LDSCT not done as of 05/19/2024.  05/19/2024 ACUTE  ov/[place] office/Vyla Pint re: GOLD  2 copd  maint on ***  *** smoker  No chief complaint on file.   Dyspnea:  *** Cough: *** Sleeping: ***   resp cc  SABA use: *** 02: ***  Lung cancer screening: ***   No obvious day to day or daytime variability or assoc excess/ purulent sputum or mucus plugs or hemoptysis or cp or chest tightness, subjective wheeze or overt sinus or hb symptoms.    Also denies any obvious fluctuation of symptoms with weather or environmental changes or other aggravating or alleviating factors except as outlined above   No unusual exposure hx or h/o childhood pna/ asthma or knowledge of premature birth.  Current Allergies, Complete Past Medical History, Past Surgical History, Family History, and Social History were reviewed in [employer] record.  ROS  The following are not active complaints unless bolded Hoarseness, sore throat, dysphagia, dental problems, itching, sneezing,  nasal congestion or discharge of excess mucus or purulent secretions, ear ache,   fever, chills, sweats, unintended wt loss or wt gain, classically pleuritic or exertional cp,  orthopnea pnd or arm/hand swelling  or leg swelling, presyncope, palpitations, abdominal pain, anorexia, nausea, vomiting, diarrhea  or change in bowel habits or change in bladder habits, change in stools or change in urine, dysuria, hematuria,  rash, arthralgias, visual complaints, headache, numbness, weakness or ataxia or problems with walking or coordination,  change in mood or  memory.        No outpatient medications have been marked as taking for the 05/19/24 encounter (Appointment) with [name], MD.                Past Medical History:  Diagnosis Date   BOILS, RECURRENT 10/10/2009   Qualifier: Diagnosis of  By: Ezzard DEVONNA Sonny GORMAN.    Chest pain in adult 09/13/2019   COPD (chronic obstructive pulmonary disease) (HCC)    Encounter for screening mammogram for malignant neoplasm of breast 08/11/2019   Inguinal  pain, right 12/09/2020   Substance abuse (HCC)    Tobacco dependence    Wound dehiscence       Objective:    wts   05/19/2024      ***   04/28/2024    205    07/16/23 201 lb (91.2 kg)  06/18/23 209 lb 3.2 oz (94.9 kg)  03/27/23 198 lb (  89.8 kg)    Vital signs reviewed  05/19/2024  - Note at rest 02 sats  ***% on ***   General appearance:    ***  Min bar ***           Assessment

## 2024-06-24 ENCOUNTER — Ambulatory Visit: Payer: MEDICAID | Admitting: Internal Medicine

## 2024-06-24 ENCOUNTER — Encounter: Payer: Self-pay | Admitting: Internal Medicine

## 2024-06-24 VITALS — BP 138/83 | HR 79 | Ht 68.0 in | Wt 211.0 lb

## 2024-06-24 DIAGNOSIS — F17210 Nicotine dependence, cigarettes, uncomplicated: Secondary | ICD-10-CM

## 2024-06-24 DIAGNOSIS — R0902 Hypoxemia: Secondary | ICD-10-CM | POA: Diagnosis not present

## 2024-06-24 DIAGNOSIS — J310 Chronic rhinitis: Secondary | ICD-10-CM

## 2024-06-24 DIAGNOSIS — J449 Chronic obstructive pulmonary disease, unspecified: Secondary | ICD-10-CM

## 2024-06-24 DIAGNOSIS — G4734 Idiopathic sleep related nonobstructive alveolar hypoventilation: Secondary | ICD-10-CM

## 2024-06-24 MED ORDER — BREZTRI AEROSPHERE 160-9-4.8 MCG/ACT IN AERO: 2.0000 | INHALATION_SPRAY | Freq: Two times a day (BID) | RESPIRATORY_TRACT | Status: AC

## 2024-06-24 NOTE — Progress Notes (Unsigned)
 Lindsay Mcmillan, female    DOB: 1971-02-05    MRN: [id number]   Brief patient profile:  39 yowf  active smoker  GOLD 2 copd criteria 11/17/19  referred to pulmonary clinic in [facility]  03/25/2023 by Jacqulyn Ahle DO  for freq bronchitis and doe x 2014 and worse since rx trelegy > no better.     History of Present Illness  03/25/2023  Pulmonary/ 1st office eval/ Lindsay Mcmillan / [place] [facility] on trelegy 100 and lisinopril  40  Chief Complaint  Patient presents with   Establish Care   Shortness of Breath  Dyspnea:  walking x 100 ft no better on trelegy (? Helped at first)  Cough: p sev hours asleep assoc with loud wheezing  Sleep: 2 pillws flat freq cough esp in am > clear  SABA use: last used it 2-3 weeks  02: none  Lung cancer screen: referred today  Rec Stop trelegy and lisinopril   Olmesartan  40 mg one daily  Continue omeprazole  Take 30-60 min before first meal of the day and add pepcid  otc 20 mg at bedtime until night time cough and wheeze are gone  Plan A = Automatic = Always=    Breztri  Take 2 puffs first thing in am and then another 2 puffs about 12 hours later.  Work on inhaler technique:  Plan B = Backup (to supplement plan A, not to replace it) Only use your albuterol  inhaler as a rescue medication  My office will be contacting you by phone for referral to lung cancer screening  [phone]  - did not do as of 07/16/2023  - if you don't hear back from my office within one week please call us  back or notify us  thru MyChart and we'll address it right away.  Please schedule a follow up office visit in 4 weeks, sooner if needed -bring inhalers    07/16/2023  f/u ov/Lindsay Mcmillan re: GOLD 2 copd /? Acei case  maint on symbicort  160  did not  bring inhalers   Chief Complaint  Patient presents with   COPD  Dyspnea:  very inactive at home/ agoraphobia  Cough: better  Sleeping: no resp cc p saba qh    resp cc  SABA use: bid and at bedtime  02: none  Lung cancer screening:  today  Rec My office will be contacting you by phone for referral to lung cancer screening  > not done as of 04/28/2024  Plan A = Automatic = Always=    Symbicort  160 Take 2 puffs first thing in am and then another 2 puffs about 12 hours later.  Work on inhaler technique:   Plan B = Backup (to supplement plan A, not to replace it) Only use your albuterol  inhaler as a rescue medication Plan C = Crisis (instead of Plan B but only if Plan B stops working) - only use your albuterol  nebulizer if you first try Plan B  Also  Ok to try albuterol  15 min before an activity (on alternating days)  that you know would usually make you short of breath     Please schedule a follow up visit in 3 months but call sooner if needed - bring inhalers    04/28/2024  f/u ov/[place] office/Lindsay Mcmillan re: GOLD 2 COPD  maint on just neb(nothing else works for her)  q 6 h    active smoker  Stage Manager Complaint  Patient presents with   COPD    SHOB / Coughing with thick white/yellow mucus getting  worse    Dyspnea:  [employer]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  no change on vs off symbiocrt  Cough: thick sticky varies yellow and white better with neb for a few hours Sleeping: bed is flat 2 pillows on side 3 x wakes up coughing    resp cc  SABA use: neb is q6  02: 3lpm  Lung cancer screening: referred 04/28/2024  Patient Instructions  My office will be contacting you by phone for referral to lung cancer screening   ([phone]) > not done as of 06/24/2024  Duoneb 4 x daily and on 1st and 3rd dose add budesonide  0.25 mg  Omeprazole  40 mg   Take  30-60 min before first meal of the day and Pepcid  (famotidine )  20 mg after supper until return to office - this is the best way to tell whether stomach acid is contributing to your problem.   GERD diet reviewed, bed blocks rec  For cough/ congestion >  mucinex dm  up to maximum of  1200 mg every 12 hours as needed  The key is to stop smoking completely before smoking  completely stops you! 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    Also denies any obvious fluctuation of symptoms with weather or environmental changes or other aggravating or alleviating factors except as outlined above   No unusual exposure hx or h/o childhood pna/ asthma or knowledge of premature birth.  Current Allergies, Complete Past Medical History, Past Surgical History, Family History, and Social History were reviewed in [employer] record.  ROS  The following are not active complaints unless bolded Hoarseness, sore throat, dysphagia, dental problems, itching, sneezing,  nasal congestion or discharge of excess mucus or purulent secretions, ear ache,   fever, chills, sweats, unintended wt loss or wt gain, classically pleuritic or exertional cp,  orthopnea pnd or arm/hand swelling  or leg swelling, presyncope, palpitations, abdominal pain, anorexia, nausea, vomiting, diarrhea  or change in bowel habits or change in bladder habits, change in stools or change in urine, dysuria, hematuria,  rash, arthralgias, visual complaints, headache, numbness, weakness or ataxia or problems with walking or coordination,  change in mood or  memory.         Outpatient Medications Prior to Visit  Medication Sig Dispense  Refill   acetaminophen  (TYLENOL ) 500 MG tablet Take 1,000 mg by mouth every 6 (six) hours as needed for mild pain or headache.     buprenorphine (SUBUTEX) 8 MG SUBL SL tablet Place 8 mg under the tongue daily.     cloNIDine (CATAPRES) 0.1 MG tablet Take 0.1 mg by mouth 3 (  three) times daily.     escitalopram (LEXAPRO) 10 MG tablet Take by mouth.     famotidine  (PEPCID ) 20 MG tablet One after supper 30 tablet 11   ipratropium-albuterol  (DUONEB) 0.5-2.5 (3) MG/3ML SOLN Inhale 3 mLs into the lungs 4 (four) times daily. 360 mL 11   meclizine  (ANTIVERT ) 25 MG tablet Take 1 tablet (25 mg total) by mouth 3 (three) times daily as needed for dizziness. 30 tablet 3   olmesartan  (BENICAR ) 40 MG tablet Take 1 tablet (40 mg total) by mouth daily. 30 tablet 11   omeprazole  (PRILOSEC) 40 MG capsule TAKE 1 CAPSULE(40 MG) BY MOUTH DAILY 90 capsule 1   ondansetron  (ZOFRAN -ODT) 4 MG disintegrating tablet Take 1 tablet (4 mg total) by mouth every 8 (eight) hours as needed for nausea or vomiting. 20 tablet 0   albuterol  (PROVENTIL ) (2.5 MG/3ML) 0.083% nebulizer solution Take 3 mLs (2.5 mg total) by nebulization every 6 (six) hours as needed. (Patient not taking: Reported on 06/24/2024) 75 mL 12   baclofen  (LIORESAL ) 10 MG tablet Take 0.5-1 tablets (5-10 mg total) by mouth 3 (three) times daily as needed for muscle spasms. (Patient not taking: Reported on 06/24/2024) 30 each 0   BRIXADI, WEEKLY, 32 MG/0.64ML SOSY  (Patient not taking: Reported on 04/28/2024)     budesonide  (PULMICORT ) 0.25 MG/2ML nebulizer solution Combine with albuterol  in nebulizer twice daily (Patient not taking: Reported on 06/24/2024) 120 mL 12   hydrochlorothiazide  (HYDRODIURIL ) 25 MG tablet Take 1 tablet (25 mg total) by mouth daily. (Patient not taking: Reported on 06/24/2024) 90 tablet 3   meloxicam  (MOBIC ) 15 MG tablet Take 1 tablet (15 mg total) by mouth daily as needed for pain. (Patient not taking: Reported on 06/24/2024) 30 tablet 0    mirabegron  ER (MYRBETRIQ ) 25 MG TB24 tablet Take 1 tablet (25 mg total) by mouth daily. (Patient not taking: Reported on 06/24/2024) 90 tablet 1   QUEtiapine (SEROQUEL) 25 MG tablet Take 25 mg by mouth at bedtime. (Patient not taking: Reported on 06/24/2024)     SUBOXONE 8-2 MG FILM Place under the tongue 2 (two) times daily. (Patient not taking: Reported on 06/24/2024)     No facility-administered medications prior to visit.       Past Medical History:  Diagnosis Date   BOILS, RECURRENT 10/10/2009   Qualifier: Diagnosis of  By: Ezzard DEVONNA Sonny GORMAN.    Chest pain in adult 09/13/2019   COPD (chronic obstructive pulmonary disease) (HCC)    Encounter for screening mammogram for malignant neoplasm of breast 08/11/2019   Inguinal pain, right 12/09/2020   Substance abuse (HCC)    Tobacco dependence    Wound dehiscence       Objective:    wts   06/24/2024     211   04/28/2024    205    07/16/23 201 lb (91.2 kg)  06/18/23 209 lb 3.2 oz (94.9 kg)  03/27/23 198 lb (89.8 kg)    Vital signs reviewed  06/24/2024  - Note at rest 02 sats  92% on RA   General appearance:    mod obese (by bmi) amb wf / congested sounding cough         HEENT : Oropharynx  clear  Nasal turbinates mod edema   NECK :  without  apparent JVD/ palpable Nodes/TM    LUNGS: no acc muscle use,  Min barrel  contour chest wall with bilateral  slightly decreased bs and late exp wheezes and  without  cough on insp or exp maneuvers and min  Hyperresonant  to  percussion bilaterally    CV:  RRR  no s3 or murmur or increase in P2, and no edema   ABD:  soft and nontender    MS:  Nl gait/ ext warm without deformities Or obvious joint restrictions  calf tenderness, cyanosis or clubbing     SKIN: warm and dry without lesions    NEURO:  alert, approp, nl sensorium with  no motor or cerebellar deficits apparent.             Assessment   Assessment & Plan COPD GOLD  2 Active Smoker - PFT's  11/17/19  FEV1 2.16  (68 % ) ratio 0.63  p 6% improvement from saba p ? prior to study with DLCO  12.74 (53%)   and FV curve flat insp portion and ERV 47 at wt 194 - CT Chest  11/17/19 Mild centrilobular emphysema  - 03/25/2023  After extensive coaching inhaler device,  effectiveness =    75% (short Ti) try change trelegy to breztri  2bid samples and approp saba - 03/25/2023 d/c acei (see hbp)  - 04/28/2024 change completely over to duoneb qid/ budesonide  0.25 bid as she says nebs ar the only thing works for her  - 06/24/2024  After extensive coaching inhaler device,  effectiveness =    75% hfa > resume breztri  and finish up supply then due to medicaid restriction  try symbicort  80 again and if fails try PA process/ continue neb saba/bud as well but just bid with up to qid for flares   Refractory AB likely related to smoking discussed separately   In meantime will place back on duoneb / symbicort  at her request  - this will cover for now  Group E in terms of symptoms/risk where  laba/ ICS plus improvised SAMA (ipatrium) better than no rx at all  at this point .     Cigarette smoker Counseled re importance of smoking cessation but did not meet time criteria for separate billing    Referred again for LDSCT to direct phone line for [facility]    Rhinitis, chronic Again rec 1st gen H1 blockers per guidelines = chlorpheniramine 4 mg 1 or 2 HS to eliminated noct pnds and help her sleep   Nocturnal hypoxemia 2lpm HS and prn as of 06/24/2024    Each RESP maintenance medication was reviewed in detail including emphasizing most importantly the difference between maintenance and prns and under what circumstances the prns are to be triggered using an action plan format where appropriate.  Total time for H and P, chart review, counseling, reviewing hfa/ neb/02  device(s) and generating customized AVS unique to this office visit / same day charting = 30 min        AVS  Patient Instructions  Call the lung cancer screening program  directly @  [phone] to discuss scheduling your low dose CT scan as soon as possible so you stay up to date.   Breztri  Take 2 puffs first thing in am and then another 2 puffs about 12 hours later until you use it up   Duoneb/ budesonide   twice daily and extra as needed  (already in pharmacy_  When you finish Breztri  supply ok to start symbicort  80  in its place  Work on inhaler technique:  relax and gently blow all the way out then take a nice smooth full deep breath back in, triggering the inhaler at same time you  start breathing in.  Hold breath in for at least  5 seconds if you can. Blow out breztr/ symbicort  thru nose. Rinse and gargle with water when done.  If mouth or throat bother you at all,  try brushing teeth/gums/tongue with arm and hammer toothpaste/ make a slurry and gargle and spit out.   The key is to stop smoking completely before smoking completely stops you!  For drainage / throat tickle try take CHLORPHENIRAMINE  4 mg  (Allergy Relief 4mg   at [facility] should be easiest to find in the blue box usually on bottom shelf)  take one every 4 hours as needed - extremely effective and inexpensive over the counter- may cause drowsiness so start with just a dose or two an hour before bedtime      Please schedule a follow up visit in 3 months but call sooner if needed                  Ozell America, MD 06/25/2024

## 2024-06-24 NOTE — Patient Instructions (Addendum)
 Call the lung cancer screening program directly @  [phone] to discuss scheduling your low dose CT scan as soon as possible so you stay up to date.   Breztri  Take 2 puffs first thing in am and then another 2 puffs about 12 hours later until you use it up   Duoneb/ budesonide   twice daily and extra as needed  (already in pharmacy_  When you finish Breztri  supply ok to start symbicort  80  in its place  Work on inhaler technique:  relax and gently blow all the way out then take a nice smooth full deep breath back in, triggering the inhaler at same time you start breathing in.  Hold breath in for at least  5 seconds if you can. Blow out breztr/ symbicort  thru nose. Rinse and gargle with water when done.  If mouth or throat bother you at all,  try brushing teeth/gums/tongue with arm and hammer toothpaste/ make a slurry and gargle and spit out.   The key is to stop smoking completely before smoking completely stops you!  For drainage / throat tickle try take CHLORPHENIRAMINE  4 mg  (Allergy Relief 4mg   at [facility] should be easiest to find in the blue box usually on bottom shelf)  take one every 4 hours as needed - extremely effective and inexpensive over the counter- may cause drowsiness so start with just a dose or two an hour before bedtime      Please schedule a follow up visit in 3 months but call sooner if needed

## 2024-06-25 DIAGNOSIS — J310 Chronic rhinitis: Secondary | ICD-10-CM | POA: Insufficient documentation

## 2024-06-25 NOTE — Assessment & Plan Note (Addendum)
 Again rec 1st gen H1 blockers per guidelines = chlorpheniramine 4 mg 1 or 2 HS to eliminated noct pnds and help her sleep

## 2024-06-25 NOTE — Assessment & Plan Note (Addendum)
 2lpm HS and prn as of 06/24/2024

## 2024-06-25 NOTE — Assessment & Plan Note (Addendum)
 Counseled re importance of smoking cessation but did not meet time criteria for separate billing    Referred again for LDSCT to direct phone line for [facility]

## 2024-06-25 NOTE — Assessment & Plan Note (Addendum)
 Active Smoker - PFT's  11/17/19  FEV1 2.16 (68 % ) ratio 0.63  p 6% improvement from saba p ? prior to study with DLCO  12.74 (53%)   and FV curve flat insp portion and ERV 47 at wt 194 - CT Chest  11/17/19 Mild centrilobular emphysema  - 03/25/2023  After extensive coaching inhaler device,  effectiveness =    75% (short Ti) try change trelegy to breztri  2bid samples and approp saba - 03/25/2023 d/c acei (see hbp)  - 04/28/2024 change completely over to duoneb qid/ budesonide  0.25 bid as she says nebs ar the only thing works for her  - 06/24/2024  After extensive coaching inhaler device,  effectiveness =    75% hfa > resume breztri  and finish up supply then due to medicaid restriction  try symbicort  80 again and if fails try PA process/ continue neb saba/bud as well but just bid with up to qid for flares   Refractory AB likely related to smoking discussed separately   In meantime will place back on duoneb / symbicort  at her request  - this will cover for now  Group E in terms of symptoms/risk where  laba/ ICS plus improvised SAMA (ipatrium) better than no rx at all  at this point .

## 2024-07-29 ENCOUNTER — Ambulatory Visit: Payer: MEDICAID | Admitting: Internal Medicine

## 2024-08-20 ENCOUNTER — Ambulatory Visit: Payer: MEDICAID | Admitting: Physician Assistant

## 2024-08-27 ENCOUNTER — Ambulatory Visit: Payer: MEDICAID | Admitting: Physician Assistant

## 2024-09-22 ENCOUNTER — Ambulatory Visit: Payer: MEDICAID | Admitting: Internal Medicine

## 2024-10-26 ENCOUNTER — Ambulatory Visit: Payer: MEDICAID | Admitting: Family Medicine
